# Patient Record
Sex: Male | Born: 1940 | Race: White | Hispanic: No | Marital: Married | Smoking: Never smoker
Health system: Southern US, Community
[De-identification: ages and names within clinical notes are randomized; demographics above are authoritative.]

## PROBLEM LIST (undated history)

## (undated) DIAGNOSIS — K219 Gastro-esophageal reflux disease without esophagitis: Secondary | ICD-10-CM

## (undated) DIAGNOSIS — C641 Malignant neoplasm of right kidney, except renal pelvis: Secondary | ICD-10-CM

## (undated) DIAGNOSIS — E785 Hyperlipidemia, unspecified: Secondary | ICD-10-CM

## (undated) DIAGNOSIS — Z9289 Personal history of other medical treatment: Secondary | ICD-10-CM

## (undated) DIAGNOSIS — M2550 Pain in unspecified joint: Secondary | ICD-10-CM

## (undated) DIAGNOSIS — N200 Calculus of kidney: Secondary | ICD-10-CM

## (undated) DIAGNOSIS — I10 Essential (primary) hypertension: Secondary | ICD-10-CM

## (undated) DIAGNOSIS — N400 Enlarged prostate without lower urinary tract symptoms: Secondary | ICD-10-CM

## (undated) HISTORY — DX: Hyperlipidemia, unspecified: E78.5

## (undated) HISTORY — DX: Personal history of other medical treatment: Z92.89

## (undated) HISTORY — PX: JOINT REPLACEMENT: SHX530

## (undated) HISTORY — DX: Pain in unspecified joint: M25.50

## (undated) HISTORY — DX: Essential (primary) hypertension: I10

## (undated) HISTORY — DX: Benign prostatic hyperplasia without lower urinary tract symptoms: N40.0

## (undated) HISTORY — PX: REPLACEMENT TOTAL KNEE: SUR1224

## (undated) HISTORY — DX: Malignant neoplasm of right kidney, except renal pelvis: C64.1

## (undated) HISTORY — DX: Calculus of kidney: N20.0

---

## 1978-05-28 HISTORY — PX: KIDNEY CYST REMOVAL: SHX684

## 2000-11-11 ENCOUNTER — Ambulatory Visit (HOSPITAL_BASED_OUTPATIENT_CLINIC_OR_DEPARTMENT_OTHER): Admission: RE | Admit: 2000-11-11 | Discharge: 2000-11-11 | Payer: Self-pay | Admitting: Orthopedic Surgery

## 2001-01-31 ENCOUNTER — Encounter: Payer: Self-pay | Admitting: Urology

## 2001-01-31 ENCOUNTER — Encounter: Admission: RE | Admit: 2001-01-31 | Discharge: 2001-01-31 | Payer: Self-pay | Admitting: Urology

## 2001-02-19 ENCOUNTER — Encounter: Admission: RE | Admit: 2001-02-19 | Discharge: 2001-02-19 | Payer: Self-pay | Admitting: Urology

## 2001-02-19 ENCOUNTER — Encounter: Payer: Self-pay | Admitting: Urology

## 2001-02-28 ENCOUNTER — Encounter: Payer: Self-pay | Admitting: Urology

## 2001-02-28 ENCOUNTER — Ambulatory Visit (HOSPITAL_COMMUNITY): Admission: RE | Admit: 2001-02-28 | Discharge: 2001-02-28 | Payer: Self-pay | Admitting: Urology

## 2001-03-04 ENCOUNTER — Encounter: Admission: RE | Admit: 2001-03-04 | Discharge: 2001-03-04 | Payer: Self-pay | Admitting: Urology

## 2001-03-04 ENCOUNTER — Encounter: Payer: Self-pay | Admitting: Urology

## 2002-03-19 ENCOUNTER — Encounter: Payer: Self-pay | Admitting: Orthopedic Surgery

## 2002-03-23 ENCOUNTER — Inpatient Hospital Stay (HOSPITAL_COMMUNITY): Admission: RE | Admit: 2002-03-23 | Discharge: 2002-03-26 | Payer: Self-pay | Admitting: Orthopedic Surgery

## 2003-06-03 ENCOUNTER — Ambulatory Visit (HOSPITAL_COMMUNITY): Admission: RE | Admit: 2003-06-03 | Discharge: 2003-06-03 | Payer: Self-pay | Admitting: Orthopedic Surgery

## 2003-07-26 ENCOUNTER — Inpatient Hospital Stay (HOSPITAL_COMMUNITY): Admission: RE | Admit: 2003-07-26 | Discharge: 2003-07-29 | Payer: Self-pay | Admitting: Orthopedic Surgery

## 2003-11-01 ENCOUNTER — Ambulatory Visit (HOSPITAL_COMMUNITY): Admission: RE | Admit: 2003-11-01 | Discharge: 2003-11-01 | Payer: Self-pay | Admitting: Internal Medicine

## 2003-11-30 ENCOUNTER — Encounter: Admission: RE | Admit: 2003-11-30 | Discharge: 2003-11-30 | Payer: Self-pay | Admitting: General Surgery

## 2003-12-01 ENCOUNTER — Ambulatory Visit (HOSPITAL_COMMUNITY): Admission: RE | Admit: 2003-12-01 | Discharge: 2003-12-01 | Payer: Self-pay | Admitting: General Surgery

## 2003-12-01 ENCOUNTER — Ambulatory Visit (HOSPITAL_BASED_OUTPATIENT_CLINIC_OR_DEPARTMENT_OTHER): Admission: RE | Admit: 2003-12-01 | Discharge: 2003-12-01 | Payer: Self-pay | Admitting: General Surgery

## 2004-05-28 HISTORY — PX: MELANOMA EXCISION: SHX5266

## 2010-08-24 ENCOUNTER — Emergency Department (HOSPITAL_BASED_OUTPATIENT_CLINIC_OR_DEPARTMENT_OTHER)
Admission: EM | Admit: 2010-08-24 | Discharge: 2010-08-24 | Disposition: A | Discharge status: Home / Self Care | Payer: Medicare Other | Attending: Emergency Medicine | Admitting: Emergency Medicine

## 2010-08-24 DIAGNOSIS — W268XXA Contact with other sharp object(s), not elsewhere classified, initial encounter: Secondary | ICD-10-CM | POA: Insufficient documentation

## 2010-08-24 DIAGNOSIS — I10 Essential (primary) hypertension: Secondary | ICD-10-CM | POA: Insufficient documentation

## 2010-08-24 DIAGNOSIS — S61409A Unspecified open wound of unspecified hand, initial encounter: Secondary | ICD-10-CM | POA: Insufficient documentation

## 2011-06-27 DIAGNOSIS — R972 Elevated prostate specific antigen [PSA]: Secondary | ICD-10-CM | POA: Diagnosis not present

## 2011-06-27 DIAGNOSIS — N401 Enlarged prostate with lower urinary tract symptoms: Secondary | ICD-10-CM | POA: Diagnosis not present

## 2011-09-05 DIAGNOSIS — R05 Cough: Secondary | ICD-10-CM | POA: Diagnosis not present

## 2011-09-05 DIAGNOSIS — J3089 Other allergic rhinitis: Secondary | ICD-10-CM | POA: Diagnosis not present

## 2011-10-10 DIAGNOSIS — M7660 Achilles tendinitis, unspecified leg: Secondary | ICD-10-CM | POA: Diagnosis not present

## 2011-10-30 DIAGNOSIS — R972 Elevated prostate specific antigen [PSA]: Secondary | ICD-10-CM | POA: Diagnosis not present

## 2011-10-30 DIAGNOSIS — N401 Enlarged prostate with lower urinary tract symptoms: Secondary | ICD-10-CM | POA: Diagnosis not present

## 2011-11-09 DIAGNOSIS — Z85820 Personal history of malignant melanoma of skin: Secondary | ICD-10-CM | POA: Diagnosis not present

## 2011-11-09 DIAGNOSIS — L570 Actinic keratosis: Secondary | ICD-10-CM | POA: Diagnosis not present

## 2011-11-09 DIAGNOSIS — D235 Other benign neoplasm of skin of trunk: Secondary | ICD-10-CM | POA: Diagnosis not present

## 2011-11-13 DIAGNOSIS — E782 Mixed hyperlipidemia: Secondary | ICD-10-CM | POA: Diagnosis not present

## 2011-11-13 DIAGNOSIS — Z79899 Other long term (current) drug therapy: Secondary | ICD-10-CM | POA: Diagnosis not present

## 2011-11-13 DIAGNOSIS — I10 Essential (primary) hypertension: Secondary | ICD-10-CM | POA: Diagnosis not present

## 2012-02-21 DIAGNOSIS — M7660 Achilles tendinitis, unspecified leg: Secondary | ICD-10-CM | POA: Diagnosis not present

## 2012-03-17 DIAGNOSIS — Z23 Encounter for immunization: Secondary | ICD-10-CM | POA: Diagnosis not present

## 2012-03-18 DIAGNOSIS — R972 Elevated prostate specific antigen [PSA]: Secondary | ICD-10-CM | POA: Diagnosis not present

## 2012-03-25 DIAGNOSIS — R972 Elevated prostate specific antigen [PSA]: Secondary | ICD-10-CM | POA: Diagnosis not present

## 2012-03-25 DIAGNOSIS — N401 Enlarged prostate with lower urinary tract symptoms: Secondary | ICD-10-CM | POA: Diagnosis not present

## 2012-03-25 DIAGNOSIS — N5314 Retrograde ejaculation: Secondary | ICD-10-CM | POA: Diagnosis not present

## 2012-03-31 ENCOUNTER — Other Ambulatory Visit: Payer: Self-pay | Admitting: Urology

## 2012-04-10 ENCOUNTER — Encounter (HOSPITAL_COMMUNITY): Payer: Self-pay | Admitting: Pharmacy Technician

## 2012-04-11 ENCOUNTER — Other Ambulatory Visit (HOSPITAL_COMMUNITY): Payer: Self-pay | Admitting: Urology

## 2012-04-14 ENCOUNTER — Encounter (HOSPITAL_COMMUNITY)
Admission: RE | Admit: 2012-04-14 | Discharge: 2012-04-14 | Disposition: A | Discharge status: Home / Self Care | Payer: Medicare Other | Source: Ambulatory Visit | Attending: Urology | Admitting: Urology

## 2012-04-14 ENCOUNTER — Ambulatory Visit (HOSPITAL_COMMUNITY)
Admission: RE | Admit: 2012-04-14 | Discharge: 2012-04-14 | Disposition: A | Discharge status: Home / Self Care | Payer: Medicare Other | Source: Ambulatory Visit | Attending: Urology | Admitting: Urology

## 2012-04-14 ENCOUNTER — Encounter (HOSPITAL_COMMUNITY): Payer: Self-pay

## 2012-04-14 DIAGNOSIS — Z01812 Encounter for preprocedural laboratory examination: Secondary | ICD-10-CM | POA: Diagnosis not present

## 2012-04-14 DIAGNOSIS — I517 Cardiomegaly: Secondary | ICD-10-CM | POA: Insufficient documentation

## 2012-04-14 DIAGNOSIS — F17200 Nicotine dependence, unspecified, uncomplicated: Secondary | ICD-10-CM | POA: Insufficient documentation

## 2012-04-14 DIAGNOSIS — R9431 Abnormal electrocardiogram [ECG] [EKG]: Secondary | ICD-10-CM | POA: Diagnosis not present

## 2012-04-14 DIAGNOSIS — Z01810 Encounter for preprocedural cardiovascular examination: Secondary | ICD-10-CM | POA: Insufficient documentation

## 2012-04-14 DIAGNOSIS — I10 Essential (primary) hypertension: Secondary | ICD-10-CM | POA: Insufficient documentation

## 2012-04-14 DIAGNOSIS — Z01818 Encounter for other preprocedural examination: Secondary | ICD-10-CM | POA: Diagnosis not present

## 2012-04-14 HISTORY — DX: Gastro-esophageal reflux disease without esophagitis: K21.9

## 2012-04-14 LAB — BASIC METABOLIC PANEL
BUN: 19 mg/dL (ref 6–23)
Calcium: 9.4 mg/dL (ref 8.4–10.5)
GFR calc Af Amer: >90 mL/min (ref >90)
GFR calc non Af Amer: 84 mL/min — ABNORMAL LOW (ref >90)
Glucose, Bld: 96 mg/dL (ref 70–99)
Potassium: 4.4 mEq/L (ref 3.5–5.1)
Sodium: 140 mEq/L (ref 135–145)

## 2012-04-14 LAB — SURGICAL PCR SCREEN
MRSA, PCR: NEGATIVE
Staphylococcus aureus: NEGATIVE

## 2012-04-14 LAB — CBC
HCT: 43 % (ref 39.0–52.0)
MCH: 30.2 pg (ref 26.0–34.0)
MCV: 86.5 fL (ref 78.0–100.0)
RBC: 4.97 MIL/uL (ref 4.22–5.81)
RDW: 12.4 % (ref 11.5–15.5)

## 2012-04-14 NOTE — Patient Instructions (Signed)
20      Your procedure is scheduled on:  Friday 04/18/2012 at 0900 am  Report to [facility] at  0630 AM.  Call this number if you have problems the morning of surgery: [phone]   Remember:   Do not eat food or drink liquids after midnight!  Take these medicines the morning of surgery with A SIP OF WATER: Proscar   Do not bring valuables to the hospital.  .  Leave suitcase in the car. After surgery it may be brought to your room.  For patients admitted to the hospital, checkout time is 11:00 AM the day of              Discharge.    Special Instructions: See [facility] Preparing  For Surgery Instruction Sheet. Do not wear jewelry, lotions powders, perfumes. Women do not shave legs or underarms for 12 hours before showers. Contacts, partial plates, or dentures may not be worn into surgery.                          Patients discharged the day of surgery will not be allowed to drive home.  If going home the same day of surgery, must have someone stay with you  first 24 hrs.at home and arrange for someone to drive you home from the Hospital.             YOUR DRIVER IS: TONI-spouse   Please read over the following fact sheets that you were given: MRSA INFORMATION,INCENTIVE SPIROMETRY SHEET, SLEEP APNEA SHEET                            Telford Nab.Jesus Wilds,RN,BSN     [phone]

## 2012-04-14 NOTE — Progress Notes (Signed)
04/14/12 0926  OBSTRUCTIVE SLEEP APNEA  Have you ever been diagnosed with sleep apnea through a sleep study? No  Do you snore loudly (loud enough to be heard through closed doors)?  0  Do you often feel tired, fatigued, or sleepy during the daytime? 0  Has anyone observed you stop breathing during your sleep? 0  Do you have, or are you being treated for high blood pressure? 1  BMI more than 35 kg/m2? 1  Age over 69 years old? 1  Neck circumference greater than 40 cm/18 inches? 1  Gender: 1  Obstructive Sleep Apnea Score 5   Score 4 or greater  Results sent to PCP

## 2012-04-18 ENCOUNTER — Encounter (HOSPITAL_COMMUNITY)
Admission: RE | Disposition: A | Discharge status: Home / Self Care | Payer: Self-pay | Source: Ambulatory Visit | Attending: Urology

## 2012-04-18 ENCOUNTER — Encounter (HOSPITAL_COMMUNITY): Payer: Self-pay | Admitting: Anesthesiology

## 2012-04-18 ENCOUNTER — Ambulatory Visit (HOSPITAL_COMMUNITY): Payer: Medicare Other | Admitting: Anesthesiology

## 2012-04-18 ENCOUNTER — Encounter (HOSPITAL_COMMUNITY): Payer: Self-pay | Admitting: *Deleted

## 2012-04-18 ENCOUNTER — Ambulatory Visit (HOSPITAL_COMMUNITY)
Admission: RE | Admit: 2012-04-18 | Discharge: 2012-04-18 | Disposition: A | Discharge status: Home / Self Care | Payer: Medicare Other | Source: Ambulatory Visit | Attending: Urology | Admitting: Urology

## 2012-04-18 DIAGNOSIS — N401 Enlarged prostate with lower urinary tract symptoms: Secondary | ICD-10-CM | POA: Insufficient documentation

## 2012-04-18 DIAGNOSIS — R972 Elevated prostate specific antigen [PSA]: Secondary | ICD-10-CM | POA: Insufficient documentation

## 2012-04-18 DIAGNOSIS — Z79899 Other long term (current) drug therapy: Secondary | ICD-10-CM | POA: Diagnosis not present

## 2012-04-18 DIAGNOSIS — Z7982 Long term (current) use of aspirin: Secondary | ICD-10-CM | POA: Insufficient documentation

## 2012-04-18 DIAGNOSIS — I10 Essential (primary) hypertension: Secondary | ICD-10-CM | POA: Diagnosis not present

## 2012-04-18 DIAGNOSIS — N5314 Retrograde ejaculation: Secondary | ICD-10-CM | POA: Insufficient documentation

## 2012-04-18 DIAGNOSIS — N400 Enlarged prostate without lower urinary tract symptoms: Secondary | ICD-10-CM | POA: Diagnosis not present

## 2012-04-18 HISTORY — PX: GREEN LIGHT LASER TURP (TRANSURETHRAL RESECTION OF PROSTATE: SHX6260

## 2012-04-18 SURGERY — GREEN LIGHT LASER TURP (TRANSURETHRAL RESECTION OF PROSTATE
Anesthesia: General | Wound class: Clean Contaminated

## 2012-04-18 MED ORDER — SULFAMETHOXAZOLE-TMP DS 800-160 MG PO TABS: 1.0000 | ORAL_TABLET | Freq: Two times a day (BID) | ORAL | Status: DC

## 2012-04-18 MED ORDER — ONDANSETRON HCL 4 MG/2ML IJ SOLN
INTRAMUSCULAR | Status: DC | PRN
  Administered 2012-04-18 (×2): 2 mg via INTRAVENOUS

## 2012-04-18 MED ORDER — LACTATED RINGERS IV SOLN
INTRAVENOUS | Status: DC | PRN
  Administered 2012-04-18 (×2): via INTRAVENOUS

## 2012-04-18 MED ORDER — DEXTROSE 5 % IV SOLN
3.0000 g | INTRAVENOUS | Status: AC
  Administered 2012-04-18: 3 g via INTRAVENOUS

## 2012-04-18 MED ORDER — SODIUM CHLORIDE 0.9 % IR SOLN
Status: DC | PRN
  Administered 2012-04-18: 3000 mL via INTRAVESICAL

## 2012-04-18 MED ORDER — LIDOCAINE HCL (CARDIAC) 20 MG/ML IV SOLN
INTRAVENOUS | Status: DC | PRN
  Administered 2012-04-18: 20 mg via INTRAVENOUS

## 2012-04-18 MED ORDER — OXYCODONE HCL 5 MG/5ML PO SOLN
5.0000 mg | Freq: Once | ORAL | Status: DC | PRN
  Filled 2012-04-18: qty 5

## 2012-04-18 MED ORDER — PROPOFOL 10 MG/ML IV EMUL
INTRAVENOUS | Status: DC | PRN
  Administered 2012-04-18: 200 mg via INTRAVENOUS
  Administered 2012-04-18: 50 mg via INTRAVENOUS
  Administered 2012-04-18: 30 mg via INTRAVENOUS
  Administered 2012-04-18: 20 mg via INTRAVENOUS

## 2012-04-18 MED ORDER — FENTANYL CITRATE 0.05 MG/ML IJ SOLN
INTRAMUSCULAR | Status: DC | PRN
  Administered 2012-04-18: 50 ug via INTRAVENOUS
  Administered 2012-04-18 (×2): 25 ug via INTRAVENOUS

## 2012-04-18 MED ORDER — CEFAZOLIN SODIUM-DEXTROSE 2-3 GM-% IV SOLR
INTRAVENOUS | Status: AC
  Filled 2012-04-18: qty 50

## 2012-04-18 MED ORDER — CEFAZOLIN SODIUM 1-5 GM-% IV SOLN
INTRAVENOUS | Status: AC
  Filled 2012-04-18: qty 50

## 2012-04-18 MED ORDER — ACETAMINOPHEN 10 MG/ML IV SOLN: 1000.0000 mg | Freq: Once | INTRAVENOUS | Status: DC | PRN

## 2012-04-18 MED ORDER — INDIGOTINDISULFONATE SODIUM 8 MG/ML IJ SOLN
INTRAMUSCULAR | Status: AC
  Filled 2012-04-18: qty 5

## 2012-04-18 MED ORDER — ACETAMINOPHEN 10 MG/ML IV SOLN
INTRAVENOUS | Status: AC
  Filled 2012-04-18: qty 100

## 2012-04-18 MED ORDER — HYDROCODONE-ACETAMINOPHEN 7.5-650 MG PO TABS: 1.0000 | ORAL_TABLET | Freq: Four times a day (QID) | ORAL | Status: DC | PRN

## 2012-04-18 MED ORDER — BELLADONNA ALKALOIDS-OPIUM 16.2-60 MG RE SUPP
RECTAL | Status: DC | PRN
  Administered 2012-04-18: 1 via RECTAL

## 2012-04-18 MED ORDER — KETOROLAC TROMETHAMINE 30 MG/ML IJ SOLN
INTRAMUSCULAR | Status: DC | PRN
  Administered 2012-04-18: 30 mg via INTRAVENOUS

## 2012-04-18 MED ORDER — MEPERIDINE HCL 50 MG/ML IJ SOLN: 6.2500 mg | INTRAMUSCULAR | Status: DC | PRN

## 2012-04-18 MED ORDER — BELLADONNA ALKALOIDS-OPIUM 16.2-60 MG RE SUPP
RECTAL | Status: AC
  Filled 2012-04-18: qty 1

## 2012-04-18 MED ORDER — ACETAMINOPHEN 10 MG/ML IV SOLN
INTRAVENOUS | Status: DC | PRN
  Administered 2012-04-18: 1000 mg via INTRAVENOUS

## 2012-04-18 MED ORDER — LACTATED RINGERS IV SOLN
INTRAVENOUS | Status: DC
  Administered 2012-04-18: 1000 mL via INTRAVENOUS

## 2012-04-18 MED ORDER — URELLE 81 MG PO TABS: 1.0000 | ORAL_TABLET | Freq: Three times a day (TID) | ORAL | Status: DC

## 2012-04-18 MED ORDER — OXYCODONE HCL 5 MG PO TABS: 5.0000 mg | ORAL_TABLET | Freq: Once | ORAL | Status: DC | PRN

## 2012-04-18 MED ORDER — HYDROMORPHONE HCL PF 1 MG/ML IJ SOLN: 0.2500 mg | INTRAMUSCULAR | Status: DC | PRN

## 2012-04-18 MED ORDER — PROMETHAZINE HCL 25 MG/ML IJ SOLN: 6.2500 mg | INTRAMUSCULAR | Status: DC | PRN

## 2012-04-18 MED ORDER — LIDOCAINE HCL 2 % EX GEL
CUTANEOUS | Status: AC
  Filled 2012-04-18: qty 10

## 2012-04-18 SURGICAL SUPPLY — 25 items
BAG URINE DRAINAGE (UROLOGICAL SUPPLIES) ×2 IMPLANT
BAG URO CATCHER STRL LF (DRAPE) ×2 IMPLANT
CATH FOLEY 2WAY SLVR  5CC 18FR (CATHETERS)
CATH FOLEY 2WAY SLVR  5CC 20FR (CATHETERS) ×1
CATH FOLEY 2WAY SLVR 5CC 18FR (CATHETERS) IMPLANT
CATH FOLEY 2WAY SLVR 5CC 20FR (CATHETERS) IMPLANT
CLOTH BEACON ORANGE TIMEOUT ST (SAFETY) ×2 IMPLANT
DRAPE CAMERA CLOSED 9X96 (DRAPES) ×2 IMPLANT
ELECT BUTTON HF 24-28F 2 30DE (ELECTRODE) IMPLANT
ELECT LOOP MED HF 24F 12D (CUTTING LOOP) IMPLANT
ELECT LOOP MED HF 24F 12D CBL (CLIP) IMPLANT
ELECT RESECT VAPORIZE 12D CBL (ELECTRODE) IMPLANT
FEE RENTAL LASER GREENLIGHT (Laser) IMPLANT
GLOVE BIOGEL M STRL SZ7.5 (GLOVE) ×2 IMPLANT
GOWN STRL REIN XL XLG (GOWN DISPOSABLE) ×2 IMPLANT
HOLDER FOLEY CATH W/STRAP (MISCELLANEOUS) ×1 IMPLANT
IV NS IRRIG 3000ML ARTHROMATIC (IV SOLUTION) ×6 IMPLANT
KIT ASPIRATION TUBING (SET/KITS/TRAYS/PACK) ×1 IMPLANT
LASER FIBER /GREENLIGHT LASER (Laser) ×1 IMPLANT
LASER GREENLIGHT RENTAL P/PROC (Laser) ×2 IMPLANT
MANIFOLD NEPTUNE II (INSTRUMENTS) ×2 IMPLANT
PACK CYSTO (CUSTOM PROCEDURE TRAY) ×2 IMPLANT
SYR 30ML LL (SYRINGE) ×1 IMPLANT
SYRINGE IRR TOOMEY STRL 70CC (SYRINGE) IMPLANT
TUBING CONNECTING 10 (TUBING) ×2 IMPLANT

## 2012-04-18 NOTE — Op Note (Signed)
Pre-operative diagnosis : BPH  Postoperative diagnosis: Same  Operation: Green light laser vaporization of prostate  Surgeon:  S. Patsi Sears, MD  First assistant: None  [id number]}  Preparation: After appropriate preanesthesia, the patient was brought to the operating room, placed on the operating table in the dorsal supine position where general LMA anesthesia was introduced. The Endo suppository was placed. IV Tylenol was given at the beginning of the case, and IV Toradol was given at the end of the case.  Review history: Active Problems  Problems  1. 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 PSA,Elevated 790.93  3. 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 He last discussed CTT on 06/27/11 because he would like to stop taking Finasteride due to interference with sex life. CTT was not available until 9/13, but he will be out of state until after Oct. He would call and make appt by end of year He was seen previously for increased PSA of 8.34 with a Psa II of 18.2% on 01-17-09 (despite being on Finasteride 5 mg 1 po daily). He subsequently underwent a prostate biopsy on 04-15-09 which was negative. He has noticed a great improvement in urinary flow, improvement in nocturia. He does have normal libido and erections; however, does have loss of sensation at orgasim and no ejaculation. Otherwise he is without complaint. Pathology shows inflammation only. PUS= 134cc--94cc to 91.8cc).      Statement of  Likelihood of Success: Excellent. TIME-OUT observed.:  Procedure: Cystourethroscopy was accomplished, and shows that the patient has trilobar BPH. A natural trough was noted at the 4:00 position, and this was developed with the greenlight laser. The lateral lobe on the left side was also vaporized. Following this, a trough at the 7:00  position was accomplished, and part of the lateral lobe on the right side was vaporized. The vera was left intact. No bleeding was noted. The patient tolerated the procedure well. A size 20 Foley catheter with 10 cc balloon was placed, with 20 cc in the balloon encased traction is necessary later. The patient was awakened and taken to recovery room in good condition.

## 2012-04-18 NOTE — Progress Notes (Signed)
Pt and wife instructed on catheter care. And how to empty cath bag

## 2012-04-18 NOTE — H&P (Signed)
omplaint  cc: Dr. Dewain Penning   Reason For Visit     4 mo f/u   Active Problems Problems  1. Benign Prostatic Hyperplasia Localized With Urinary Obstruction With Other Lower Urinary Tract  Symptoms 600.21 2. PSA,Elevated 790.93 3. 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  He last discussed CTT on 06/27/11 because he would like to stop taking Finasteride due to interference with sex life.  CTT was not available until 9/13, but he will be out of state until after Oct. He would call and make appt by end of year   He was seen previously for increased PSA of 8.34 with a Psa II of 18.2% on 01-17-09 (despite being on Finasteride 5 mg 1 po daily).  He subsequently underwent a prostate biopsy on 04-15-09 which was negative.   He has noticed a great improvement in urinary flow, improvement in nocturia.  He does have normal libido and erections; however, does have loss of sensation at orgasim and no ejaculation.  Otherwise he is without complaint. Pathology shows  inflammation only. PUS= 134cc--94cc to 91.8cc).  03/18/12  PSA - 4.09/32%  (corrected PSA of  8.18 due to taking Finasteride) 10/30/11  PSA - 2.52/24% ( corrected PSA of 5.04 due to taking Finasteride) 01/03/11  PSA - 2.38 (corrected value of 4.76 due to taking Finasteride).   Past Medical History Problems  1. History of  Cancer 199.1 2. History of  Hypertension 401.9 3. History of  Nephrolithiasis V13.01  Surgical History Problems  1. History of  Knee Replacement  Current Meds 1. Aspirin 81 MG Oral Tablet; Therapy: (Recorded:27Jan2010) to 2. Benazepril HCl 40 MG Oral Tablet; Therapy: 26May2011 to 3. Loratadine 10 MG Oral Tablet; Therapy: 10Apr2013 to  Allergies Medication  1. No Known Drug Allergies  Family History Problems  1. Paternal history of  Death In The Family Father age 47; cancer 2. Maternal history of  Death In The Family  Mother age 48; heart problems 3. Family history of  Family Health Status Number Of Children 2 sons  Social History Problems  1. Caffeine Use 1 a day 2. Marital History - Currently Married 3. Never A Smoker 4. Occupation: retired Nurse, children's  5. History of  Alcohol Use 6. History of  Tobacco Use  Review of Systems Constitutional, skin, eye, otolaryngeal, hematologic/lymphatic, cardiovascular, pulmonary, endocrine, musculoskeletal, gastrointestinal, neurological and psychiatric system(s) were reviewed and pertinent findings if present are noted.  Genitourinary: urinary frequency, nocturia and erectile dysfunction.    Vitals Vital Signs [Data Includes: Last 1 Day]  29Oct2013 08:19AM  Blood Pressure: 130 / 83 Temperature: 97.8 F Heart Rate: 77  Physical Exam Rectal: Rectal exam demonstrates normal sphincter tone. Estimated prostate size is 4+.    Results/Data   IPSS: The IPSS today is 3  Selected Results  PSA FREE & TOTAL 22Oct2013 08:29AM Jesus Yang  SPECIMEN TYPE: BLOOD   Test Name Result Flag Reference  PSA 4.09 ng/mL H <=4.00  Test Methodology: ECLIA PSA (Electrochemiluminescence Immunoassay) Result repeated and verified.  PSA, FREE 1.32 ng/mL    PSA, %FREE 32 %  > 25  Probability of Prostate Cancer   (For Men with Non-Suspicious DRE Results and PSA Between 4 and   10 ng/mL, By Patient Age)     % free PSA  Patient Age                          60 to 8 Years  50 to 76 Years  >70 Years    <=10%                  49.2%           57.5%          64.5%    11 - 18%               26.9%           33.9%          40.8%    19 - 25%               18.3%           23.9%          29.7%    >25%                    9.1%           12.2%          15.8%    25 Mar 2012 7:52 AM   UA With REFLEX       COLOR YELLOW       APPEARANCE CLEAR       SPECIFIC GRAVITY 1.030       pH 6.0       GLUCOSE NEG       BILIRUBIN NEG       KETONE NEG       BLOOD NEG        PROTEIN NEG       UROBILINOGEN 0.2       NITRITE NEG       LEUKOCYTE ESTERASE NEG   Assessment Assessed  1. Benign Prostatic Hyperplasia Localized With Urinary Obstruction With Other Lower Urinary Tract  Symptoms 600.21 2. PSA,Elevated 790.93 3. Retrograde Ejaculation 45.87      71 yo male with 93 gram prostate post finestereide terapy ( was 134cc). He has reduced his IPSS to 3; however, he has reduced his libido, and his ejaculate wit the finesteride, and now wants to get off the medication. We have re-cked his PUS today ( 903 grams), and wil proceed with trough with  Burna Mortimer laser vs Gyrus button/loop. He wil be done as op and RTC for catheter removal 2 days post op.    Plan    Schedule surgical intervention.     UA With REFLEX  Status: Resulted - Requires Verification  Done: 01Jan0001 12:00AM Ordered Today; For: Health Maintenance (V70.0); Ordered By: Jesus Yang  Due: 31Oct2013 Marked Important; Last Updated By: Nathaniel Man PROSTATE U/S  Status: Resulted - Requires Verification  Done: 01Jan0001 12:00AM Ordered Today; For: Benign Prostatic Hyperplasia Localized With Urinary Obstruction With Other Lower Urinary Tract Symptoms (600.21); Ordered By: Jesus Yang  Due: 31Oct2013 Marked Important; Last Updated By: Dorcas Mcmurray   Signatures Electronically signed by : Jesus Yang, M.D.; Mar 25 2012 10:47AM

## 2012-04-18 NOTE — Anesthesia Preprocedure Evaluation (Addendum)
Anesthesia Evaluation  Patient identified by MRN, date of birth, ID band Patient awake    Reviewed: Allergy & Precautions, H&P , NPO status , Patient's Chart, lab work & pertinent test results  Airway Mallampati: II TM Distance: >3 FB Neck ROM: Full    Dental  (+) Dental Advisory Given, Teeth Intact and Chipped   Pulmonary  breath sounds clear to auscultation  Pulmonary exam normal       Cardiovascular hypertension, Pt. on medications Rhythm:Regular Rate:Normal     Neuro/Psych negative neurological ROS  negative psych ROS   GI/Hepatic Neg liver ROS, GERD-  Medicated,  Endo/Other  negative endocrine ROS  Renal/GU Renal disease     Musculoskeletal negative musculoskeletal ROS (+)   Abdominal   Peds  Hematology negative hematology ROS (+)   Anesthesia Other Findings   Reproductive/Obstetrics                          Anesthesia Physical Anesthesia Plan  ASA: III  Anesthesia Plan: General   Post-op Pain Management:    Induction: Intravenous  Airway Management Planned: LMA  Additional Equipment:   Intra-op Plan:   Post-operative Plan: Extubation in OR  Informed Consent: I have reviewed the patients History and Physical, chart, labs and discussed the procedure including the risks, benefits and alternatives for the proposed anesthesia with the patient or authorized representative who has indicated his/her understanding and acceptance.   Dental advisory given  Plan Discussed with: CRNA  Anesthesia Plan Comments:        Anesthesia Quick Evaluation

## 2012-04-18 NOTE — Transfer of Care (Signed)
Immediate Anesthesia Transfer of Care Note  Patient: Jesus Yang  Procedure(s) Performed: Procedure(s) (LRB) with comments: GREEN LIGHT LASER TURP (TRANSURETHRAL RESECTION OF PROSTATE (N/A)  Patient Location: PACU  Anesthesia Type:General  Level of Consciousness: awake, alert , oriented and patient cooperative  Airway & Oxygen Therapy: Patient Spontanous Breathing and Patient connected to face mask  Post-op Assessment: Report given to PACU RN and Post -op Vital signs reviewed and stable  Post vital signs: Reviewed and stable  Complications: No apparent anesthesia complications

## 2012-04-18 NOTE — Interval H&P Note (Signed)
History and Physical Interval Note:  04/18/2012 9:26 AM  Jesus Yang  has presented today for surgery, with the diagnosis of benign prostatic hyperplasia  The various methods of treatment have been discussed with the patient and family. After consideration of risks, benefits and other options for treatment, the patient has consented to  Procedure(s) (LRB) with comments: GREEN LIGHT LASER TURP (TRANSURETHRAL RESECTION OF PROSTATE (N/A) - Burna Mortimer Laser or Gyrus Loop Button Vaporization of Prostate as a surgical intervention .  The patient's history has been reviewed, patient examined, no change in status, stable for surgery.  I have reviewed the patient's chart and labs.  Questions were answered to the patient's satisfaction.     Jethro Bolus I

## 2012-04-18 NOTE — Anesthesia Postprocedure Evaluation (Signed)
Anesthesia Post Note  Patient: Jesus Yang  Procedure(s) Performed: Procedure(s) (LRB): GREEN LIGHT LASER TURP (TRANSURETHRAL RESECTION OF PROSTATE (N/A)  Anesthesia type: General  Patient location: PACU  Post pain: Pain level controlled  Post assessment: Post-op Vital signs reviewed  Last Vitals: BP 134/70  Pulse 70  Temp 36.6 C  Resp 12  SpO2 99%  Post vital signs: Reviewed  Level of consciousness: sedated  Complications: No apparent anesthesia complications

## 2012-04-21 ENCOUNTER — Encounter (HOSPITAL_COMMUNITY): Payer: Self-pay | Admitting: Urology

## 2012-05-02 DIAGNOSIS — N401 Enlarged prostate with lower urinary tract symptoms: Secondary | ICD-10-CM | POA: Diagnosis not present

## 2012-08-06 DIAGNOSIS — N401 Enlarged prostate with lower urinary tract symptoms: Secondary | ICD-10-CM | POA: Diagnosis not present

## 2012-10-21 ENCOUNTER — Other Ambulatory Visit: Payer: Self-pay | Admitting: Gastroenterology

## 2012-10-21 DIAGNOSIS — K5730 Diverticulosis of large intestine without perforation or abscess without bleeding: Secondary | ICD-10-CM | POA: Diagnosis not present

## 2012-10-21 DIAGNOSIS — Z1211 Encounter for screening for malignant neoplasm of colon: Secondary | ICD-10-CM | POA: Diagnosis not present

## 2012-10-21 DIAGNOSIS — D126 Benign neoplasm of colon, unspecified: Secondary | ICD-10-CM | POA: Diagnosis not present

## 2012-12-31 ENCOUNTER — Encounter: Payer: Self-pay | Admitting: *Deleted

## 2013-01-13 DIAGNOSIS — L819 Disorder of pigmentation, unspecified: Secondary | ICD-10-CM | POA: Diagnosis not present

## 2013-01-13 DIAGNOSIS — D1801 Hemangioma of skin and subcutaneous tissue: Secondary | ICD-10-CM | POA: Diagnosis not present

## 2013-01-13 DIAGNOSIS — L570 Actinic keratosis: Secondary | ICD-10-CM | POA: Diagnosis not present

## 2013-01-13 DIAGNOSIS — D235 Other benign neoplasm of skin of trunk: Secondary | ICD-10-CM | POA: Diagnosis not present

## 2013-01-13 DIAGNOSIS — Z85820 Personal history of malignant melanoma of skin: Secondary | ICD-10-CM | POA: Diagnosis not present

## 2013-04-01 DIAGNOSIS — I10 Essential (primary) hypertension: Secondary | ICD-10-CM | POA: Diagnosis not present

## 2013-04-01 DIAGNOSIS — Z0000 Encounter for general adult medical examination without abnormal findings: Secondary | ICD-10-CM | POA: Diagnosis not present

## 2013-04-01 DIAGNOSIS — Z23 Encounter for immunization: Secondary | ICD-10-CM | POA: Diagnosis not present

## 2013-04-01 DIAGNOSIS — B079 Viral wart, unspecified: Secondary | ICD-10-CM | POA: Diagnosis not present

## 2013-04-02 ENCOUNTER — Other Ambulatory Visit: Payer: Self-pay | Admitting: Dermatology

## 2013-04-02 DIAGNOSIS — C44319 Basal cell carcinoma of skin of other parts of face: Secondary | ICD-10-CM | POA: Diagnosis not present

## 2013-05-01 DIAGNOSIS — Z85828 Personal history of other malignant neoplasm of skin: Secondary | ICD-10-CM | POA: Diagnosis not present

## 2013-05-01 DIAGNOSIS — D485 Neoplasm of uncertain behavior of skin: Secondary | ICD-10-CM | POA: Diagnosis not present

## 2013-05-08 DIAGNOSIS — R05 Cough: Secondary | ICD-10-CM | POA: Diagnosis not present

## 2013-05-11 DIAGNOSIS — I517 Cardiomegaly: Secondary | ICD-10-CM | POA: Diagnosis not present

## 2013-05-11 DIAGNOSIS — R05 Cough: Secondary | ICD-10-CM | POA: Diagnosis not present

## 2013-05-13 DIAGNOSIS — R0989 Other specified symptoms and signs involving the circulatory and respiratory systems: Secondary | ICD-10-CM | POA: Diagnosis not present

## 2013-05-13 DIAGNOSIS — R05 Cough: Secondary | ICD-10-CM | POA: Diagnosis not present

## 2013-05-13 DIAGNOSIS — R059 Cough, unspecified: Secondary | ICD-10-CM | POA: Diagnosis not present

## 2013-05-13 DIAGNOSIS — I517 Cardiomegaly: Secondary | ICD-10-CM | POA: Diagnosis not present

## 2013-05-13 DIAGNOSIS — H6120 Impacted cerumen, unspecified ear: Secondary | ICD-10-CM | POA: Diagnosis not present

## 2013-05-13 DIAGNOSIS — R0609 Other forms of dyspnea: Secondary | ICD-10-CM | POA: Diagnosis not present

## 2013-05-29 DIAGNOSIS — R972 Elevated prostate specific antigen [PSA]: Secondary | ICD-10-CM | POA: Diagnosis not present

## 2013-05-29 DIAGNOSIS — N401 Enlarged prostate with lower urinary tract symptoms: Secondary | ICD-10-CM | POA: Diagnosis not present

## 2013-05-29 DIAGNOSIS — N139 Obstructive and reflux uropathy, unspecified: Secondary | ICD-10-CM | POA: Diagnosis not present

## 2013-07-01 ENCOUNTER — Ambulatory Visit (INDEPENDENT_AMBULATORY_CARE_PROVIDER_SITE_OTHER): Payer: Medicare Other

## 2013-07-01 ENCOUNTER — Ambulatory Visit (INDEPENDENT_AMBULATORY_CARE_PROVIDER_SITE_OTHER): Payer: Medicare Other | Admitting: Podiatry

## 2013-07-01 ENCOUNTER — Encounter: Payer: Self-pay | Admitting: Podiatry

## 2013-07-01 VITALS — BP 167/88 | HR 72 | Resp 16

## 2013-07-01 DIAGNOSIS — M7750 Other enthesopathy of unspecified foot: Secondary | ICD-10-CM | POA: Diagnosis not present

## 2013-07-01 DIAGNOSIS — M79673 Pain in unspecified foot: Secondary | ICD-10-CM

## 2013-07-01 DIAGNOSIS — M79609 Pain in unspecified limb: Secondary | ICD-10-CM

## 2013-07-01 DIAGNOSIS — R609 Edema, unspecified: Secondary | ICD-10-CM

## 2013-07-01 MED ORDER — TRIAMCINOLONE ACETONIDE 10 MG/ML IJ SUSP
10.0000 mg | Freq: Once | INTRAMUSCULAR | Status: AC
  Administered 2013-07-01: 10 mg

## 2013-07-01 NOTE — Progress Notes (Signed)
Subjective:     Patient ID: Jesus Yang, male   DOB: 1940-07-28, 73 y.o.   MRN: [id number]  HPI patient points to right foot and stating it's been hurting in the inside of the foot were about 2 months. Points to the cuneiform and base of the first metatarsal states it's been inflamed with no history of injury   Review of Systems     Objective:   Physical Exam Neurovascular status intact with discomfort around the base of the first metatarsocuneiform with no loss of muscle strength anterior tibial or posterior tibial muscle    Assessment:     Tendinitis right anterior tibial muscle at its insertion into cuneiform base of first metatarsal    Plan:     X-ray reviewed and injection accomplished. He is going to babysit his grandchildren for the next 2 weeks and will be seen back after that for possible immobilization

## 2013-07-16 ENCOUNTER — Encounter: Payer: Self-pay | Admitting: Podiatry

## 2013-07-16 ENCOUNTER — Ambulatory Visit (INDEPENDENT_AMBULATORY_CARE_PROVIDER_SITE_OTHER): Payer: Medicare Other | Admitting: Podiatry

## 2013-07-16 VITALS — BP 144/83 | HR 79 | Resp 18

## 2013-07-16 DIAGNOSIS — M7750 Other enthesopathy of unspecified foot: Secondary | ICD-10-CM | POA: Diagnosis not present

## 2013-07-16 NOTE — Progress Notes (Signed)
° °  Subjective:    Patient ID: Jesus Yang, male    DOB: March 20, 1941, 73 y.o.   MRN: [id number]  HPI It has improved on my right foot    Review of Systems     Objective:   Physical Exam        Assessment & Plan:

## 2013-07-16 NOTE — Progress Notes (Signed)
Subjective:     Patient ID: Jesus Yang, male   DOB: Oct 18, 1940, 73 y.o.   MRN: [id number]  HPI patient states that his foot has improved but it still is tender when he weightbears for a significant period of time   Review of Systems     Objective:   Physical Exam Neurovascular status unchanged with patient well oriented x3 with mild discomfort at the anterior tibial insertion into the base of first metatarsal cuneiform joint    Assessment:     Continue tendinitis but improved right foot    Plan:     Advised on physical therapy ice therapy and scanned for custom orthotics to lift the medial arch. Reappoint when orthotics returned

## 2013-07-31 DIAGNOSIS — Z85828 Personal history of other malignant neoplasm of skin: Secondary | ICD-10-CM | POA: Diagnosis not present

## 2013-08-20 ENCOUNTER — Ambulatory Visit (INDEPENDENT_AMBULATORY_CARE_PROVIDER_SITE_OTHER): Payer: Medicare Other | Admitting: Podiatry

## 2013-08-20 ENCOUNTER — Encounter: Payer: Self-pay | Admitting: Podiatry

## 2013-08-20 VITALS — BP 137/73 | HR 84 | Resp 16

## 2013-08-20 DIAGNOSIS — M7750 Other enthesopathy of unspecified foot: Secondary | ICD-10-CM

## 2013-08-20 NOTE — Progress Notes (Signed)
Subjective:     Patient ID: Jesus Yang, male   DOB: 01-13-1941, 73 y.o.   MRN: [id number]  HPI patient presents to pick up orthotics stating my foot has been okay but him on it too much I still noticed   Review of Systems     Objective:   Physical Exam Neurovascular status intact with no health history changes noted and patient having discomfort at the anterior tib insertion right of a mild nature    Assessment:     Improved tendinitis symptoms right    Plan:     Orthotics dispensed with instructions and physical therapy discussed with patient reappoint her recheck in 6 weeks

## 2013-08-20 NOTE — Patient Instructions (Signed)

## 2013-10-15 DIAGNOSIS — M5030 Other cervical disc degeneration, unspecified cervical region: Secondary | ICD-10-CM | POA: Diagnosis not present

## 2013-10-15 DIAGNOSIS — M25519 Pain in unspecified shoulder: Secondary | ICD-10-CM | POA: Diagnosis not present

## 2013-10-15 DIAGNOSIS — M542 Cervicalgia: Secondary | ICD-10-CM | POA: Diagnosis not present

## 2013-10-19 ENCOUNTER — Emergency Department (HOSPITAL_BASED_OUTPATIENT_CLINIC_OR_DEPARTMENT_OTHER)
Admission: EM | Admit: 2013-10-19 | Discharge: 2013-10-19 | Disposition: A | Discharge status: Home / Self Care | Payer: Medicare Other | Attending: Emergency Medicine | Admitting: Emergency Medicine

## 2013-10-19 ENCOUNTER — Encounter (HOSPITAL_BASED_OUTPATIENT_CLINIC_OR_DEPARTMENT_OTHER): Payer: Self-pay | Admitting: Emergency Medicine

## 2013-10-19 DIAGNOSIS — Z8719 Personal history of other diseases of the digestive system: Secondary | ICD-10-CM | POA: Insufficient documentation

## 2013-10-19 DIAGNOSIS — E785 Hyperlipidemia, unspecified: Secondary | ICD-10-CM | POA: Insufficient documentation

## 2013-10-19 DIAGNOSIS — I10 Essential (primary) hypertension: Secondary | ICD-10-CM | POA: Insufficient documentation

## 2013-10-19 DIAGNOSIS — Z87442 Personal history of urinary calculi: Secondary | ICD-10-CM | POA: Diagnosis not present

## 2013-10-19 DIAGNOSIS — R42 Dizziness and giddiness: Secondary | ICD-10-CM | POA: Insufficient documentation

## 2013-10-19 DIAGNOSIS — Z7982 Long term (current) use of aspirin: Secondary | ICD-10-CM | POA: Insufficient documentation

## 2013-10-19 MED ORDER — MECLIZINE HCL 50 MG PO TABS: 50.0000 mg | ORAL_TABLET | Freq: Three times a day (TID) | ORAL | Status: DC | PRN

## 2013-10-19 MED ORDER — MECLIZINE HCL 25 MG PO TABS
25.0000 mg | ORAL_TABLET | Freq: Once | ORAL | Status: AC
  Administered 2013-10-19: 25 mg via ORAL
  Filled 2013-10-19: qty 1

## 2013-10-19 NOTE — ED Notes (Signed)
Denies dizziness at present time  

## 2013-10-19 NOTE — Discharge Instructions (Signed)
No driving until 24 hours without vertigo. Take meclizine until 24 hours without vertigo.  Benign Positional Vertigo Vertigo means you feel like you or your surroundings are moving when they are not. Benign positional vertigo is the most common form of vertigo. Benign means that the cause of your condition is not serious. Benign positional vertigo is more common in older adults. CAUSES  Benign positional vertigo is the result of an upset in the labyrinth system. This is an area in the middle ear that helps control your balance. This may be caused by a viral infection, head injury, or repetitive motion. However, often no specific cause is found. SYMPTOMS  Symptoms of benign positional vertigo occur when you move your head or eyes in different directions. Some of the symptoms may include:  Loss of balance and falls.  Vomiting.  Blurred vision.  Dizziness.  Nausea.  Involuntary eye movements (nystagmus). DIAGNOSIS  Benign positional vertigo is usually diagnosed by physical exam. If the specific cause of your benign positional vertigo is unknown, your caregiver may perform imaging tests, such as magnetic resonance imaging (MRI) or computed tomography (CT). TREATMENT  Your caregiver may recommend movements or procedures to correct the benign positional vertigo. Medicines such as meclizine, benzodiazepines, and medicines for nausea may be used to treat your symptoms. In rare cases, if your symptoms are caused by certain conditions that affect the inner ear, you may need surgery. HOME CARE INSTRUCTIONS   Follow your caregiver's instructions.  Move slowly. Do not make sudden body or head movements.  Avoid driving.  Avoid operating heavy machinery.  Avoid performing any tasks that would be dangerous to you or others during a vertigo episode.  Drink enough fluids to keep your urine clear or pale yellow. SEEK IMMEDIATE MEDICAL CARE IF:   You develop problems with walking, weakness,  numbness, or using your arms, hands, or legs.  You have difficulty speaking.  You develop severe headaches.  Your nausea or vomiting continues or gets worse.  You develop visual changes.  Your family or friends notice any behavioral changes.  Your condition gets worse.  You have a fever.  You develop a stiff neck or sensitivity to light. MAKE SURE YOU:   Understand these instructions.  Will watch your condition.  Will get help right away if you are not doing well or get worse. Document Released: 02/19/2006 Document Revised: 08/06/2011 Document Reviewed: 02/01/2011 [facility] Patient Information 2014 [place].

## 2013-10-19 NOTE — ED Provider Notes (Signed)
CSN: [id number]     Arrival date & time 10/19/13  1136 History   First MD Initiated Contact with Patient 10/19/13 1224     Chief Complaint  Patient presents with  . Dizziness     ) HPI  73 year old male otherwise healthy with exception of mild hypertension presents with dizziness described as spinning. Today is Monday. He states Saturday evening he lay down in bed. When he did he thought the room was spinning. He had a slowly lay down over a period of an hour. Is able to get to sleep. Saturday night he was able to sleep. Sunday (which was yesterday) vertigo with head movements or position change during the day. Improve how last evening. More improve this morning but does persist with rapid head movements. They're leaving on a trip on Wednesday or Thursday and he wanted to be evaluated. No headache. No vision changes. No nausea. No numbness weakness or tingling to extremities. No other neurological complaints. No past similar episodes.  Past Medical History  Diagnosis Date  . Kidney stone   . Hypertension   . Hyperlipidemia   . GERD (gastroesophageal reflux disease)    Past Surgical History  Procedure Laterality Date  . Kidney cyst removal  1980  . Replacement total knee    . Melanoma excision  2006    of chest  . Joint replacement      bilateral knee  . Green light laser turp (transurethral resection of prostate  04/18/2012    Procedure: GREEN LIGHT LASER TURP (TRANSURETHRAL RESECTION OF PROSTATE;  Surgeon: Ailene Rud, MD;  Location: [facility];  Service: Urology;  Laterality: N/A;   No family history on file. History  Substance Use Topics  . Smoking status: Never Smoker   . Smokeless tobacco: Not on file  . Alcohol Use: Yes     Comment: occassionally    Review of Systems  Constitutional: Negative for fever, chills, diaphoresis, appetite change and fatigue.  HENT: Negative for mouth sores, sore throat and trouble swallowing.   Eyes: Negative for visual disturbance.   Respiratory: Negative for cough, chest tightness, shortness of breath and wheezing.   Cardiovascular: Negative for chest pain.  Gastrointestinal: Negative for nausea, vomiting, abdominal pain, diarrhea and abdominal distention.  Endocrine: Negative for polydipsia, polyphagia and polyuria.  Genitourinary: Negative for dysuria, frequency and hematuria.  Musculoskeletal: Negative for gait problem.  Skin: Negative for color change, pallor and rash.  Neurological: Positive for dizziness. Negative for syncope, light-headedness and headaches.  Hematological: Does not bruise/bleed easily.  Psychiatric/Behavioral: Negative for behavioral problems and confusion.      Allergies  Review of patient's allergies indicates no known allergies.  Home Medications   Prior to Admission medications   Medication Sig Start Date End Date Taking? Authorizing Provider  aspirin 81 MG tablet Take 81 mg by mouth daily.    Historical Provider, MD  atorvastatin (LIPITOR) 10 MG tablet  05/04/13   Historical Provider, MD  benazepril (LOTENSIN) 40 MG tablet Take 40 mg by mouth every morning.    Historical Provider, MD  meclizine (ANTIVERT) 50 MG tablet Take 1 tablet (50 mg total) by mouth 3 (three) times daily as needed for dizziness (take until 24 hours wihtout vertigo). 10/19/13   Tanna Furry, MD   BP 142/85  Pulse 88  Temp(Src) 98.7 F (37.1 C) (Oral)  Resp 18  Ht 6' (1.829 m)  Wt 275 lb (124.739 kg)  BMI 37.29 kg/m2  SpO2 98% Physical Exam  Constitutional:  He is oriented to person, place, and time. He appears well-developed and well-nourished. No distress.  HENT:  Head: Normocephalic.  Eyes: Conjunctivae are normal. Pupils are equal, round, and reactive to light. No scleral icterus.  Neck: Normal range of motion. Neck supple. No thyromegaly present.  Cardiovascular: Normal rate and regular rhythm.  Exam reveals no gallop and no friction rub.   No murmur heard. Pulmonary/Chest: Effort normal and breath  sounds normal. No respiratory distress. He has no wheezes. He has no rales.  Abdominal: Soft. Bowel sounds are normal. He exhibits no distension. There is no tenderness. There is no rebound.  Musculoskeletal: Normal range of motion.  Neurological: He is alert and oriented to person, place, and time.  Normal neurological exam. No cranial nerve abnormalities. No inducible nystagmus. Asymptomatic type movements. No carotid bruits. No pronator drift. Normal gait.  Skin: Skin is warm and dry. No rash noted.  Psychiatric: He has a normal mood and affect. His behavior is normal.    ED Course  Procedures (including critical care time) Labs Review Labs Reviewed - No data to display  Imaging Review No results found.   EKG Interpretation None      MDM   Final diagnoses:  Vertigo    Normal neurological exam. Only risk for vascular disorders:hypertension and age. Symptoms suggestive of acute peripheral vertigo/benign paroxysmal vertigo. Given meclizine. Prescription for meclizine. No driving for 24 hours without vertigo. Take the meclizine until 24 hours without vertigo    Tanna Furry, MD 10/19/13 [phone]

## 2014-01-01 DIAGNOSIS — D235 Other benign neoplasm of skin of trunk: Secondary | ICD-10-CM | POA: Diagnosis not present

## 2014-01-01 DIAGNOSIS — C4359 Malignant melanoma of other part of trunk: Secondary | ICD-10-CM | POA: Diagnosis not present

## 2014-01-01 DIAGNOSIS — D1801 Hemangioma of skin and subcutaneous tissue: Secondary | ICD-10-CM | POA: Diagnosis not present

## 2014-01-01 DIAGNOSIS — L570 Actinic keratosis: Secondary | ICD-10-CM | POA: Diagnosis not present

## 2014-01-19 DIAGNOSIS — Z23 Encounter for immunization: Secondary | ICD-10-CM | POA: Diagnosis not present

## 2014-04-07 DIAGNOSIS — M25561 Pain in right knee: Secondary | ICD-10-CM | POA: Diagnosis not present

## 2014-04-07 DIAGNOSIS — M25562 Pain in left knee: Secondary | ICD-10-CM | POA: Diagnosis not present

## 2014-04-07 DIAGNOSIS — I10 Essential (primary) hypertension: Secondary | ICD-10-CM | POA: Diagnosis not present

## 2014-04-07 DIAGNOSIS — E782 Mixed hyperlipidemia: Secondary | ICD-10-CM | POA: Diagnosis not present

## 2014-04-07 DIAGNOSIS — Z0000 Encounter for general adult medical examination without abnormal findings: Secondary | ICD-10-CM | POA: Diagnosis not present

## 2014-04-07 DIAGNOSIS — N402 Nodular prostate without lower urinary tract symptoms: Secondary | ICD-10-CM | POA: Diagnosis not present

## 2014-06-30 DIAGNOSIS — R972 Elevated prostate specific antigen [PSA]: Secondary | ICD-10-CM | POA: Diagnosis not present

## 2014-06-30 DIAGNOSIS — R351 Nocturia: Secondary | ICD-10-CM | POA: Diagnosis not present

## 2014-06-30 DIAGNOSIS — N401 Enlarged prostate with lower urinary tract symptoms: Secondary | ICD-10-CM | POA: Diagnosis not present

## 2014-08-25 DIAGNOSIS — H109 Unspecified conjunctivitis: Secondary | ICD-10-CM | POA: Diagnosis not present

## 2014-08-27 DIAGNOSIS — H16143 Punctate keratitis, bilateral: Secondary | ICD-10-CM | POA: Diagnosis not present

## 2014-11-16 DIAGNOSIS — Z0000 Encounter for general adult medical examination without abnormal findings: Secondary | ICD-10-CM | POA: Diagnosis not present

## 2014-11-16 DIAGNOSIS — N402 Nodular prostate without lower urinary tract symptoms: Secondary | ICD-10-CM | POA: Diagnosis not present

## 2014-11-16 DIAGNOSIS — E782 Mixed hyperlipidemia: Secondary | ICD-10-CM | POA: Diagnosis not present

## 2014-11-25 DIAGNOSIS — H16143 Punctate keratitis, bilateral: Secondary | ICD-10-CM | POA: Diagnosis not present

## 2014-12-23 DIAGNOSIS — H2513 Age-related nuclear cataract, bilateral: Secondary | ICD-10-CM | POA: Diagnosis not present

## 2014-12-27 DIAGNOSIS — L821 Other seborrheic keratosis: Secondary | ICD-10-CM | POA: Diagnosis not present

## 2014-12-27 DIAGNOSIS — Z85820 Personal history of malignant melanoma of skin: Secondary | ICD-10-CM | POA: Diagnosis not present

## 2014-12-27 DIAGNOSIS — L814 Other melanin hyperpigmentation: Secondary | ICD-10-CM | POA: Diagnosis not present

## 2014-12-27 DIAGNOSIS — Z08 Encounter for follow-up examination after completed treatment for malignant neoplasm: Secondary | ICD-10-CM | POA: Diagnosis not present

## 2015-01-19 DIAGNOSIS — I10 Essential (primary) hypertension: Secondary | ICD-10-CM | POA: Diagnosis not present

## 2015-01-19 DIAGNOSIS — E782 Mixed hyperlipidemia: Secondary | ICD-10-CM | POA: Diagnosis not present

## 2015-02-21 DIAGNOSIS — Z23 Encounter for immunization: Secondary | ICD-10-CM | POA: Diagnosis not present

## 2015-06-25 ENCOUNTER — Encounter (HOSPITAL_BASED_OUTPATIENT_CLINIC_OR_DEPARTMENT_OTHER): Payer: Self-pay | Admitting: *Deleted

## 2015-06-25 ENCOUNTER — Emergency Department (HOSPITAL_BASED_OUTPATIENT_CLINIC_OR_DEPARTMENT_OTHER)
Admission: EM | Admit: 2015-06-25 | Discharge: 2015-06-25 | Disposition: A | Discharge status: Home / Self Care | Payer: Medicare Other | Attending: Emergency Medicine | Admitting: Emergency Medicine

## 2015-06-25 DIAGNOSIS — E785 Hyperlipidemia, unspecified: Secondary | ICD-10-CM | POA: Diagnosis not present

## 2015-06-25 DIAGNOSIS — Z87442 Personal history of urinary calculi: Secondary | ICD-10-CM | POA: Diagnosis not present

## 2015-06-25 DIAGNOSIS — N390 Urinary tract infection, site not specified: Secondary | ICD-10-CM | POA: Diagnosis not present

## 2015-06-25 DIAGNOSIS — Z79899 Other long term (current) drug therapy: Secondary | ICD-10-CM | POA: Diagnosis not present

## 2015-06-25 DIAGNOSIS — R319 Hematuria, unspecified: Secondary | ICD-10-CM

## 2015-06-25 DIAGNOSIS — Z9889 Other specified postprocedural states: Secondary | ICD-10-CM | POA: Diagnosis not present

## 2015-06-25 DIAGNOSIS — I10 Essential (primary) hypertension: Secondary | ICD-10-CM | POA: Diagnosis not present

## 2015-06-25 DIAGNOSIS — Z8719 Personal history of other diseases of the digestive system: Secondary | ICD-10-CM | POA: Diagnosis not present

## 2015-06-25 DIAGNOSIS — Z7982 Long term (current) use of aspirin: Secondary | ICD-10-CM | POA: Insufficient documentation

## 2015-06-25 LAB — BASIC METABOLIC PANEL
Anion gap: 8 (ref 5–15)
BUN: 20 mg/dL (ref 6–20)
CALCIUM: 8.9 mg/dL (ref 8.9–10.3)
CO2: 26 mmol/L (ref 22–32)
CREATININE: 0.94 mg/dL (ref 0.61–1.24)
Chloride: 108 mmol/L (ref 101–111)
GFR calc Af Amer: >60 mL/min (ref >60)
GFR calc non Af Amer: >60 mL/min (ref >60)
GLUCOSE: 103 mg/dL — AB (ref 65–99)
Potassium: 4.1 mmol/L (ref 3.5–5.1)
Sodium: 142 mmol/L (ref 135–145)

## 2015-06-25 LAB — CBC
HCT: 42.8 % (ref 39.0–52.0)
Hemoglobin: 14.4 g/dL (ref 13.0–17.0)
MCH: 30.1 pg (ref 26.0–34.0)
MCHC: 33.6 g/dL (ref 30.0–36.0)
MCV: 89.4 fL (ref 78.0–100.0)
PLATELETS: 156 10*3/uL (ref 150–400)
RBC: 4.79 MIL/uL (ref 4.22–5.81)
RDW: 12.7 % (ref 11.5–15.5)
WBC: 6.2 10*3/uL (ref 4.0–10.5)

## 2015-06-25 LAB — URINALYSIS, ROUTINE W REFLEX MICROSCOPIC
BILIRUBIN URINE: NEGATIVE
GLUCOSE, UA: NEGATIVE mg/dL
KETONES UR: NEGATIVE mg/dL
Nitrite: NEGATIVE
PH: 5.5 (ref 5.0–8.0)
Protein, ur: 100 mg/dL — AB
Specific Gravity, Urine: 1.024 (ref 1.005–1.030)

## 2015-06-25 LAB — URINE MICROSCOPIC-ADD ON

## 2015-06-25 MED ORDER — CIPROFLOXACIN HCL 500 MG PO TABS
500.0000 mg | ORAL_TABLET | Freq: Once | ORAL | Status: AC
  Administered 2015-06-25: 500 mg via ORAL
  Filled 2015-06-25: qty 1

## 2015-06-25 MED ORDER — CIPROFLOXACIN HCL 500 MG PO TABS: 500.0000 mg | ORAL_TABLET | Freq: Two times a day (BID) | ORAL | Status: DC

## 2015-06-25 NOTE — ED Notes (Signed)
Removed PIV (20G) from R AC; catheter intact. Site unremarkable -- no redness, swelling, bruising.

## 2015-06-25 NOTE — ED Notes (Signed)
Patient is alert and oriented x4.  He is complaining of hematuria.  He states that he noticed today that his urine was  Significantly darker than usual.  He adds that yesterday he did have a fall and landed on his back.  Patient denies any  Pain or other symptoms.

## 2015-06-25 NOTE — ED Provider Notes (Signed)
CSN: [id number]     Arrival date & time 06/25/15  2015 History  By signing my name below, I, Jesus Yang, attest that this documentation has been prepared under the direction and in the presence of Alfonzo Beers, MD. Electronically Signed: [name], ED Scribe. 06/25/2015. 9:17 PM   Chief Complaint  Patient presents with  . Hematuria   Patient is a 75 y.o. male presenting with hematuria. The history is provided by the patient. No language interpreter was used.  Hematuria This is a new problem. The current episode started yesterday. The problem has not changed since onset.Pertinent negatives include no chest pain and no abdominal pain. Nothing aggravates the symptoms. Nothing relieves the symptoms. He has tried nothing for the symptoms.   Jesus Yang is a 75 y.o. male with history of kidney stones and TURP who presents to the Emergency Department complaining of increasing hematuria with onset yesterday. Pt notes that prior to the onset of symptoms he tripped and fell backwards from standing while doing yard work.  Pt denies back pain, fever, chills, abdominal pain, nausea, vomiting, dysuria, increased frequency, and increased urgency. No head injury or LOC.   Past Medical History  Diagnosis Date  . Kidney stone   . Hypertension   . Hyperlipidemia   . GERD (gastroesophageal reflux disease)    Past Surgical History  Procedure Laterality Date  . Kidney cyst removal  1980  . Replacement total knee    . Melanoma excision  2006    of chest  . Joint replacement      bilateral knee  . Green light laser turp (transurethral resection of prostate  04/18/2012    Procedure: GREEN LIGHT LASER TURP (TRANSURETHRAL RESECTION OF PROSTATE;  Surgeon: Ailene Rud, MD;  Location: [facility];  Service: Urology;  Laterality: N/A;   History reviewed. No pertinent family history. Social History  Substance Use Topics  . Smoking status: Never Smoker   . Smokeless tobacco: None  . Alcohol  Use: Yes     Comment: occassionally    Review of Systems  Constitutional: Negative for fever and chills.  Cardiovascular: Negative for chest pain.  Gastrointestinal: Negative for nausea, vomiting and abdominal pain.  Genitourinary: Positive for hematuria.  Musculoskeletal: Negative for back pain.  ROS reviewed and all otherwise negative except for mentioned in HPI  Allergies  Review of patient's allergies indicates no known allergies.  Home Medications   Prior to Admission medications   Medication Sig Start Date End Date Taking? Authorizing Provider  aspirin 81 MG tablet Take 81 mg by mouth daily.    Historical Provider, MD  atorvastatin (LIPITOR) 10 MG tablet  05/04/13   Historical Provider, MD  benazepril (LOTENSIN) 40 MG tablet Take 40 mg by mouth every morning.    Historical Provider, MD  ciprofloxacin (CIPRO) 500 MG tablet Take 1 tablet (500 mg total) by mouth every 12 (twelve) hours. 06/25/15   Alfonzo Beers, MD  meclizine (ANTIVERT) 50 MG tablet Take 1 tablet (50 mg total) by mouth 3 (three) times daily as needed for dizziness (take until 24 hours wihtout vertigo). 10/19/13   Tanna Furry, MD   BP 163/96 mmHg  Pulse 78  Temp(Src) 98.5 F (36.9 C) (Oral)  Resp 18  Ht 6' (1.829 m)  Wt 275 lb (124.739 kg)  BMI 37.29 kg/m2  SpO2 100% Vitals reviewed Physical Exam  Physical Examination: General appearance - alert, well appearing, and in no distress Mental status - alert, oriented to person, place,  and time Eyes -no conjunctival injection, no scleral icterus Mouth - mucous membranes moist, pharynx normal without lesions Chest - clear to auscultation, no wheezes, rales or rhonchi, symmetric air entry Heart - normal rate, regular rhythm, normal S1, S2, no murmurs, rubs, clicks or gallops Abdomen - soft, nontender, nondistended, no masses or organomegaly, nontender Back exam - full range of motion, no midline tenderness, no CVA tenderness, no bruising or abrasions Neurological -  alert, oriented, normal speech Extremities - peripheral pulses normal, no pedal edema, no clubbing or cyanosis Skin - normal coloration and turgor, no rashes  ED Course  Procedures (including critical care time) DIAGNOSTIC STUDIES: Oxygen Saturation is 100% on RA,  normal by my interpretation.    COORDINATION OF CARE: 9:16 PM Discussed treatment plan which includes lab work with pt at bedside and pt agreed to plan.  Labs Review Labs Reviewed  URINALYSIS, ROUTINE W REFLEX MICROSCOPIC (NOT AT [facility]) - Abnormal; Notable for the following:    Color, Urine RED (*)    APPearance CLOUDY (*)    Hgb urine dipstick LARGE (*)    Protein, ur 100 (*)    Leukocytes, UA TRACE (*)    All other components within normal limits  BASIC METABOLIC PANEL - Abnormal; Notable for the following:    Glucose, Bld 103 (*)    All other components within normal limits  URINE MICROSCOPIC-ADD ON - Abnormal; Notable for the following:    Squamous Epithelial / LPF 0-5 (*)    Bacteria, UA MANY (*)    All other components within normal limits  URINE CULTURE  CBC    Imaging Review No results found. I have personally reviewed and evaluated these lab results as part of my medical decision-making.   EKG Interpretation None      MDM   Final diagnoses:  Hematuria  UTI (lower urinary tract infection)    Pt presenting with c/o hematuria.  Had a fall yesterday - but no back or abdominal pain to suggest renal hematoma.  Urine has many bacteria- suspect UTI, urine culture sent- pt started on cipro.  Renal function is normal.  Pt will f/u with urology.  Discharged with strict return precautions.  Pt agreeable with plan.  I personally performed the services described in this documentation, which was scribed in my presence. The recorded information has been reviewed and is accurate.     Alfonzo Beers, MD 06/25/15 2250

## 2015-06-25 NOTE — Discharge Instructions (Signed)
Return to the ED with any concerns including abdominal pain, back pain, difficulty passing urine, fever/chills, vomiting and not able to keep down antibiotics, decreased level of alertness/lethargy, or any other alarming symptoms

## 2015-06-26 ENCOUNTER — Emergency Department (HOSPITAL_BASED_OUTPATIENT_CLINIC_OR_DEPARTMENT_OTHER)
Admission: EM | Admit: 2015-06-26 | Discharge: 2015-06-26 | Disposition: A | Discharge status: Home / Self Care | Payer: Medicare Other | Attending: Emergency Medicine | Admitting: Emergency Medicine

## 2015-06-26 ENCOUNTER — Encounter (HOSPITAL_BASED_OUTPATIENT_CLINIC_OR_DEPARTMENT_OTHER): Payer: Self-pay

## 2015-06-26 DIAGNOSIS — K439 Ventral hernia without obstruction or gangrene: Secondary | ICD-10-CM | POA: Insufficient documentation

## 2015-06-26 DIAGNOSIS — R319 Hematuria, unspecified: Secondary | ICD-10-CM

## 2015-06-26 DIAGNOSIS — Z87442 Personal history of urinary calculi: Secondary | ICD-10-CM | POA: Insufficient documentation

## 2015-06-26 DIAGNOSIS — R339 Retention of urine, unspecified: Secondary | ICD-10-CM | POA: Diagnosis not present

## 2015-06-26 DIAGNOSIS — Z79899 Other long term (current) drug therapy: Secondary | ICD-10-CM | POA: Insufficient documentation

## 2015-06-26 DIAGNOSIS — I10 Essential (primary) hypertension: Secondary | ICD-10-CM | POA: Insufficient documentation

## 2015-06-26 DIAGNOSIS — E785 Hyperlipidemia, unspecified: Secondary | ICD-10-CM | POA: Insufficient documentation

## 2015-06-26 DIAGNOSIS — Z7982 Long term (current) use of aspirin: Secondary | ICD-10-CM | POA: Insufficient documentation

## 2015-06-26 LAB — URINALYSIS, ROUTINE W REFLEX MICROSCOPIC
Bilirubin Urine: NEGATIVE
GLUCOSE, UA: 100 mg/dL — AB
Ketones, ur: NEGATIVE mg/dL
LEUKOCYTES UA: NEGATIVE
Nitrite: NEGATIVE
PH: 7.5 (ref 5.0–8.0)
Specific Gravity, Urine: 1.031 — ABNORMAL HIGH (ref 1.005–1.030)

## 2015-06-26 LAB — URINE MICROSCOPIC-ADD ON

## 2015-06-26 NOTE — ED Notes (Signed)
Pt voided scant amt of very bright red, appears to be frank blood into urinal, insufficient amt for UA

## 2015-06-26 NOTE — ED Notes (Signed)
Patient here with ongoing hematuria from visit yesterday and reports increased blood clots and 1 episode of urinary retention last pm. Patient diagnosed with hematuria and uti yesterday and taking antibiotic as directed

## 2015-06-26 NOTE — ED Notes (Signed)
Pt was seen in this ED yesterday and dx with UTI per EDP, given Rx for Cipro 500mg  PO q12 hours. Presents today with difficulty voiding, having urgency, states had to strain in order for urine to pass, then noted "alot of bright red blood", at approx 0800 voided again, again had to void at 1000hrs, noted had difficulty again passing urine and noted bright red blood in urine, returns for re-evaluation.

## 2015-06-26 NOTE — ED Provider Notes (Signed)
CSN: [id number]     Arrival date & time 06/26/15  1133 History   First MD Initiated Contact with Patient 06/26/15 1240     Chief Complaint  Patient presents with  . Urinary Retention  . Hematuria     (Consider location/radiation/quality/duration/timing/severity/associated sxs/prior Treatment) HPI Patient fell while doing yard work 2 days ago landing on his right upper back. He presented here yesterday complaining of hematuria. He was prescribed Cipro and urine sent for culture. Today he presents as he's noted clots in his urine and has had some difficulty in getting his urinary stream started though feels that he is able to empty his bladder. Denies pain anywhere. No fever. No other associated symptoms. Nothing makes symptoms better or worse Past Medical History  Diagnosis Date  . Kidney stone   . Hypertension   . Hyperlipidemia   . GERD (gastroesophageal reflux disease)    Past Surgical History  Procedure Laterality Date  . Kidney cyst removal  1980  . Replacement total knee    . Melanoma excision  2006    of chest  . Joint replacement      bilateral knee  . Green light laser turp (transurethral resection of prostate  04/18/2012    Procedure: GREEN LIGHT LASER TURP (TRANSURETHRAL RESECTION OF PROSTATE;  Surgeon: Ailene Rud, MD;  Location: [facility];  Service: Urology;  Laterality: N/A;   No family history on file. Social History  Substance Use Topics  . Smoking status: Never Smoker   . Smokeless tobacco: None  . Alcohol Use: Yes     Comment: occassionally    Review of Systems  Constitutional: Negative.   HENT: Negative.   Respiratory: Negative.   Cardiovascular: Negative.   Gastrointestinal: Negative.   Genitourinary: Positive for dysuria and hematuria.  Musculoskeletal: Negative.   Skin: Negative.   Neurological: Negative.   Psychiatric/Behavioral: Negative.   All other systems reviewed and are negative.     Allergies  Review of patient's allergies  indicates no known allergies.  Home Medications   Prior to Admission medications   Medication Sig Start Date End Date Taking? Authorizing Provider  aspirin 81 MG tablet Take 81 mg by mouth daily.    Historical Provider, MD  atorvastatin (LIPITOR) 10 MG tablet  05/04/13   Historical Provider, MD  benazepril (LOTENSIN) 40 MG tablet Take 40 mg by mouth every morning.    Historical Provider, MD  ciprofloxacin (CIPRO) 500 MG tablet Take 1 tablet (500 mg total) by mouth every 12 (twelve) hours. 06/25/15   Alfonzo Beers, MD  meclizine (ANTIVERT) 50 MG tablet Take 1 tablet (50 mg total) by mouth 3 (three) times daily as needed for dizziness (take until 24 hours wihtout vertigo). 10/19/13   Tanna Furry, MD   BP 156/67 mmHg  Pulse 70  Temp(Src) 98.2 F (36.8 C) (Oral)  Resp 18  Ht 6' (1.829 m)  Wt 275 lb (124.739 kg)  BMI 37.29 kg/m2  SpO2 96% Physical Exam  Constitutional: He appears well-developed and well-nourished.  HENT:  Head: Normocephalic and atraumatic.  Eyes: Conjunctivae are normal. Pupils are equal, round, and reactive to light.  Neck: Neck supple. No tracheal deviation present. No thyromegaly present.  Cardiovascular: Normal rate and regular rhythm.   No murmur heard. Pulmonary/Chest: Effort normal and breath sounds normal.  Abdominal: Soft. Bowel sounds are normal. He exhibits no distension. There is no tenderness.  Ventral hernia, reduces spontaneously. Obese  Genitourinary: Penis normal.  Musculoskeletal: Normal range of motion. He  exhibits no edema or tenderness.  Neurological: He is alert. Coordination normal.  Skin: Skin is warm and dry. No rash noted.  Psychiatric: He has a normal mood and affect.  Nursing note and vitals reviewed.   ED Course  Procedures (including critical care time) Labs Review Labs Reviewed - No data to display  Imaging Review No results found. I have personally reviewed and evaluated these images and lab results as part of my medical  decision-making.   EKG Interpretation None     Post void residual equals 0 based on bladder scan Results for orders placed or performed during the hospital encounter of 06/26/15  Urinalysis, Routine w reflex microscopic (not at [facility])  Result Value Ref Range   Color, Urine YELLOW YELLOW   APPearance TURBID (A) CLEAR   Specific Gravity, Urine 1.031 (H) 1.005 - 1.030   pH 7.5 5.0 - 8.0   Glucose, UA 100 (A) NEGATIVE mg/dL   Hgb urine dipstick LARGE (A) NEGATIVE   Bilirubin Urine NEGATIVE NEGATIVE   Ketones, ur NEGATIVE NEGATIVE mg/dL   Protein, ur >300 (A) NEGATIVE mg/dL   Nitrite NEGATIVE NEGATIVE   Leukocytes, UA NEGATIVE NEGATIVE  Urine microscopic-add on  Result Value Ref Range   Squamous Epithelial / LPF 0-5 (A) NONE SEEN   WBC, UA 0-5 0 - 5 WBC/hpf   RBC / HPF TOO NUMEROUS TO COUNT 0 - 5 RBC/hpf   Bacteria, UA MANY (A) NONE SEEN   Urine-Other URINALYSIS PERFORMED ON SUPERNATANT    No results found.  MDM  Discussed with patient that he may need a Foley catheter if he does into urinary retention. Since he is not retaining urine presently will not insert Foley He is instructed to call Dr.Tannenbaum tomorrow to schedule the next available follow-up appointment. Urine culture pending from yesterday. Diagnosis hematuria Final diagnoses:  None        Orlie Dakin, MD 06/26/15 1435

## 2015-06-26 NOTE — ED Notes (Signed)
Patient denies pain and is resting comfortably.  

## 2015-06-26 NOTE — ED Notes (Signed)
DC instructions reviewed with pt from EDP orders, pt understood to contact MD at [facility] in [place] in AM to have follow up visit. Opportunity for questions provided

## 2015-06-26 NOTE — Discharge Instructions (Signed)
Hematuria, Adult Call Dr. Gaynelle Arabian tomorrow to schedule next available office appointment with him or with any of his partners Hematuria is blood in your urine. It can be caused by a bladder infection, kidney infection, prostate infection, kidney stone, or cancer of your urinary tract. Infections can usually be treated with medicine, and a kidney stone usually will pass through your urine. If neither of these is the cause of your hematuria, further workup to find out the reason may be needed. It is very important that you tell your health care provider about any blood you see in your urine, even if the blood stops without treatment or happens without causing pain. Blood in your urine that happens and then stops and then happens again can be a symptom of a very serious condition. Also, pain is not a symptom in the initial stages of many urinary cancers. HOME CARE INSTRUCTIONS   Drink lots of fluid, 3-4 quarts a day. If you have been diagnosed with an infection, cranberry juice is especially recommended, in addition to large amounts of water.  Avoid caffeine, tea, and carbonated beverages because they tend to irritate the bladder.  Avoid alcohol because it may irritate the prostate.  Take all medicines as directed by your health care provider.  If you were prescribed an antibiotic medicine, finish it all even if you start to feel better.  If you have been diagnosed with a kidney stone, follow your health care provider's instructions regarding straining your urine to catch the stone.  Empty your bladder often. Avoid holding urine for long periods of time.  After a bowel movement, women should cleanse front to back. Use each tissue only once.  Empty your bladder before and after sexual intercourse if you are a male. SEEK MEDICAL CARE IF:  You develop back pain.  You have a fever.  You have a feeling of sickness in your stomach (nausea) or vomiting.  Your symptoms are not better in 3  days. Return sooner if you are getting worse. SEEK IMMEDIATE MEDICAL CARE IF:   You develop severe vomiting and are unable to keep the medicine down.  You develop severe back or abdominal pain despite taking your medicines.  You begin passing a large amount of blood or clots in your urine.  You feel extremely weak or faint, or you pass out. MAKE SURE YOU:   Understand these instructions.  Will watch your condition.  Will get help right away if you are not doing well or get worse.   This information is not intended to replace advice given to you by your health care provider. Make sure you discuss any questions you have with your health care provider.   Document Released: 05/14/2005 Document Revised: 06/04/2014 Document Reviewed: 01/12/2013 Elsevier Interactive Patient Education [employer].

## 2015-06-27 DIAGNOSIS — N401 Enlarged prostate with lower urinary tract symptoms: Secondary | ICD-10-CM | POA: Diagnosis not present

## 2015-06-27 DIAGNOSIS — R972 Elevated prostate specific antigen [PSA]: Secondary | ICD-10-CM | POA: Diagnosis not present

## 2015-06-27 DIAGNOSIS — R3129 Other microscopic hematuria: Secondary | ICD-10-CM | POA: Diagnosis not present

## 2015-06-27 DIAGNOSIS — Z0000 Encounter for general adult medical examination without abnormal findings: Secondary | ICD-10-CM | POA: Diagnosis not present

## 2015-06-27 DIAGNOSIS — R351 Nocturia: Secondary | ICD-10-CM | POA: Diagnosis not present

## 2015-06-27 DIAGNOSIS — R310 Gross hematuria: Secondary | ICD-10-CM | POA: Diagnosis not present

## 2015-06-27 LAB — URINE CULTURE: CULTURE: NO GROWTH

## 2015-06-29 ENCOUNTER — Other Ambulatory Visit (HOSPITAL_COMMUNITY): Payer: Self-pay | Admitting: Urology

## 2015-06-29 DIAGNOSIS — N289 Disorder of kidney and ureter, unspecified: Secondary | ICD-10-CM

## 2015-07-11 ENCOUNTER — Ambulatory Visit (HOSPITAL_COMMUNITY): Admission: RE | Admit: 2015-07-11 | Payer: Medicare Other | Source: Ambulatory Visit

## 2015-07-12 ENCOUNTER — Encounter: Payer: Self-pay | Admitting: Family Medicine

## 2015-07-13 ENCOUNTER — Ambulatory Visit (INDEPENDENT_AMBULATORY_CARE_PROVIDER_SITE_OTHER): Payer: Medicare Other | Admitting: Family Medicine

## 2015-07-13 ENCOUNTER — Encounter: Payer: Self-pay | Admitting: Family Medicine

## 2015-07-13 VITALS — BP 125/83 | HR 71 | Temp 98.7°F | Resp 20 | Ht 72.0 in | Wt 281.5 lb

## 2015-07-13 DIAGNOSIS — Z23 Encounter for immunization: Secondary | ICD-10-CM | POA: Diagnosis not present

## 2015-07-13 DIAGNOSIS — R319 Hematuria, unspecified: Secondary | ICD-10-CM | POA: Diagnosis not present

## 2015-07-13 DIAGNOSIS — Z7189 Other specified counseling: Secondary | ICD-10-CM

## 2015-07-13 DIAGNOSIS — I10 Essential (primary) hypertension: Secondary | ICD-10-CM | POA: Diagnosis not present

## 2015-07-13 DIAGNOSIS — Z7689 Persons encountering health services in other specified circumstances: Secondary | ICD-10-CM

## 2015-07-13 DIAGNOSIS — E785 Hyperlipidemia, unspecified: Secondary | ICD-10-CM

## 2015-07-13 MED ORDER — BENAZEPRIL HCL 40 MG PO TABS: 40.0000 mg | ORAL_TABLET | Freq: Every morning | ORAL | Status: DC

## 2015-07-13 NOTE — Patient Instructions (Signed)

## 2015-07-13 NOTE — Progress Notes (Addendum)
Patient ID: Jesus Yang, male   DOB: 1940-07-29, 75 y.o.   MRN: [id number]      Patient ID: Jesus Yang, male  DOB: 1940-08-27, 75 y.o.   MRN: [id number]  Subjective:  Jesus Yang is a 75 y.o. male present for establishment of care. All past medical history, surgical history, allergies, family history, immunizations, medications and social history were obtained and entered in the electronic medical record today. All recent labs, ED visits and hospitalizations within the last year were reviewed.  Patient is prior PCP was Dr. Greta Doom.  Hematuria: Of note patient reports he had an episode 2 weeks ago when he was coming out of the weights he sustained a fall. He states he just tripped over brushing. He started urinating blood, and was seen in the emergency room. He was then followed with urology. They believe he "dislodged a kidney stone "by patient's reports. He states that the hematuria has stopped. He has had a cystoscope which he states they said they did not find any concerning lesions, but had a few "blips "that has the urologist mildly concerned and he has a MRI study to be completed within the near future. Patient has appropriate follow-up for this condition, he just wanted to make this provider where since it is a new problem for him.  Health maintenance:  Colonoscopy:2014, follow-up in 10 years. Immunizations: Tdap indicated, flu shot up-to-date, PNA23 and Zostavax up-to-date, prevnar 13 indicated.  Infectious disease screening: Unknown PSA: Followed by urology, BPH  Past Medical History  Diagnosis Date  . Kidney stone   . Hypertension   . Hyperlipidemia   . GERD (gastroesophageal reflux disease)   . BPH (benign prostatic hyperplasia)     follows with urology  . Melanoma ([place])   . Arthralgia     bilateral knees  . History of EKG     incomplete RBBB   No Known Allergies Past Surgical History  Procedure Laterality Date  . Kidney cyst removal  1980  . Replacement  total knee    . Melanoma excision  2006    of chest  . Joint replacement      bilateral knee  . Green light laser turp (transurethral resection of prostate  04/18/2012    Procedure: GREEN LIGHT LASER TURP (TRANSURETHRAL RESECTION OF PROSTATE;  Surgeon: Ailene Rud, MD;  Location: [facility];  Service: Urology;  Laterality: N/A;   Family History  Problem Relation Age of Onset  . Melanoma Father   . Colon cancer Neg Hx    Social History   Social History  . Marital Status: Married    Spouse Name: N/A  . Number of Children: N/A  . Years of Education: N/A   Occupational History  . Not on file.   Social History Main Topics  . Smoking status: Never Smoker   . Smokeless tobacco: Never Used  . Alcohol Use: Yes     Comment: occassionally  . Drug Use: No  . Sexual Activity: Yes   Other Topics Concern  . Not on file   Social History Narrative   Retired from TEFL teacher. Married. 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    Caffeinated beverages.   Wears his seatbelt, wears a bicycle helmet, exercises at least 3 times a week.   Takes a daily vitamin.   There is a smoke alarm in the home.   There are firearms in the home, locked.           ROS: Negative,  with the exception of above mentioned in HPI  Objective: BP 125/83 mmHg  Pulse 71  Temp(Src) 98.7 F (37.1 C)  Resp 20  Ht 6' (1.829 m)  Wt 281 lb 8 oz (127.688 kg)  BMI 38.17 kg/m2  SpO2 97% Gen: Afebrile. No acute distress. Nontoxic in appearance, well-developed, well-nourished, Caucasian male. HENT: AT. [place]. Bilateral TM visualized and normal in appearance, normal external auditory canal. MMM, no oral lesion.  Bilateral nares within normal limits. Throat without erythema, ulcerations or exudates.  Eyes:Pupils Equal Round Reactive to light, Extraocular movements intact,  Conjunctiva without redness, discharge or icterus. Neck/lymp/endocrine: Supple, no lymphadenopathy CV: RRR, no edema, +2/4 P posterior tibialis pulses.   Chest: CTAB, no wheeze, rhonchi or crackles. Abd: Soft. NTND. BS present  Skin: No rashes, purpura or petechiae. Warm and well-perfused. Skin intact. Neuro/Msk: Normal gait. PERLA. EOMi. Alert. Oriented x3.   Psych: Normal affect, dress and demeanor. Normal speech. Normal thought content and judgment.  Assessment/plan: HUNT WALTZER is a 75 y.o. male  present for establishment of care 1. Need for prophylactic vaccination with combined diphtheria-tetanus-pertussis (DTP) vaccine - Tdap vaccine greater than or equal to 7yo IM  2. Hematuria - Continue to follow with urology.  3. Essential hypertension, benign - Stable - Continue current regimen, refills on Benzapril provided today. - Continue daily baby aspirin - Follow-up in 6 months.  4. Hyperlipidemia - Continue atorvastatin, patient desiring to come off medication if possible on next lipid check. He does not need refills on this medication today. - Ask [place] diet and exercise with patient today.  - Follow-up in 6 months on chronic issues, or complete physical scheduled sooner.    Howard Pouch, DO [place]

## 2015-07-14 ENCOUNTER — Encounter: Payer: Self-pay | Admitting: Family Medicine

## 2015-07-14 DIAGNOSIS — R319 Hematuria, unspecified: Secondary | ICD-10-CM | POA: Insufficient documentation

## 2015-07-14 DIAGNOSIS — I10 Essential (primary) hypertension: Secondary | ICD-10-CM | POA: Insufficient documentation

## 2015-07-14 DIAGNOSIS — E785 Hyperlipidemia, unspecified: Secondary | ICD-10-CM | POA: Insufficient documentation

## 2015-07-15 ENCOUNTER — Encounter: Payer: Self-pay | Admitting: Family Medicine

## 2015-07-17 ENCOUNTER — Encounter: Payer: Self-pay | Admitting: Family Medicine

## 2015-07-18 ENCOUNTER — Ambulatory Visit (HOSPITAL_COMMUNITY)
Admission: RE | Admit: 2015-07-18 | Discharge: 2015-07-18 | Disposition: A | Discharge status: Home / Self Care | Payer: Medicare Other | Source: Ambulatory Visit | Attending: Urology | Admitting: Urology

## 2015-07-18 ENCOUNTER — Other Ambulatory Visit (HOSPITAL_COMMUNITY): Payer: Self-pay | Admitting: Urology

## 2015-07-18 ENCOUNTER — Other Ambulatory Visit: Payer: Self-pay | Admitting: Urology

## 2015-07-18 ENCOUNTER — Ambulatory Visit (HOSPITAL_COMMUNITY): Admission: RE | Admit: 2015-07-18 | Payer: Medicare Other | Source: Ambulatory Visit

## 2015-07-18 DIAGNOSIS — N289 Disorder of kidney and ureter, unspecified: Secondary | ICD-10-CM

## 2015-07-28 ENCOUNTER — Ambulatory Visit
Admission: RE | Admit: 2015-07-28 | Discharge: 2015-07-28 | Disposition: A | Discharge status: Home / Self Care | Payer: Medicare Other | Source: Ambulatory Visit | Attending: Urology | Admitting: Urology

## 2015-07-28 DIAGNOSIS — N289 Disorder of kidney and ureter, unspecified: Secondary | ICD-10-CM

## 2015-08-02 DIAGNOSIS — N401 Enlarged prostate with lower urinary tract symptoms: Secondary | ICD-10-CM | POA: Diagnosis not present

## 2015-08-02 DIAGNOSIS — R972 Elevated prostate specific antigen [PSA]: Secondary | ICD-10-CM | POA: Diagnosis not present

## 2015-08-02 DIAGNOSIS — Z0000 Encounter for general adult medical examination without abnormal findings: Secondary | ICD-10-CM | POA: Diagnosis not present

## 2015-08-02 DIAGNOSIS — D4101 Neoplasm of uncertain behavior of right kidney: Secondary | ICD-10-CM | POA: Diagnosis not present

## 2015-08-02 DIAGNOSIS — R351 Nocturia: Secondary | ICD-10-CM | POA: Diagnosis not present

## 2015-08-03 ENCOUNTER — Ambulatory Visit
Admission: RE | Admit: 2015-08-03 | Discharge: 2015-08-03 | Disposition: A | Discharge status: Home / Self Care | Payer: Medicare Other | Source: Ambulatory Visit | Attending: Urology | Admitting: Urology

## 2015-08-03 ENCOUNTER — Other Ambulatory Visit: Payer: Self-pay | Admitting: Urology

## 2015-08-03 ENCOUNTER — Ambulatory Visit: Admission: RE | Admit: 2015-08-03 | Payer: Medicare Other | Source: Ambulatory Visit

## 2015-08-03 DIAGNOSIS — R319 Hematuria, unspecified: Secondary | ICD-10-CM | POA: Diagnosis not present

## 2015-08-03 DIAGNOSIS — N289 Disorder of kidney and ureter, unspecified: Secondary | ICD-10-CM

## 2015-08-03 MED ORDER — GADOBENATE DIMEGLUMINE 529 MG/ML IV SOLN
20.0000 mL | Freq: Once | INTRAVENOUS | Status: AC | PRN
  Administered 2015-08-03: 20 mL via INTRAVENOUS

## 2015-08-29 DIAGNOSIS — N281 Cyst of kidney, acquired: Secondary | ICD-10-CM | POA: Diagnosis not present

## 2015-08-29 DIAGNOSIS — R310 Gross hematuria: Secondary | ICD-10-CM | POA: Diagnosis not present

## 2015-08-29 DIAGNOSIS — R972 Elevated prostate specific antigen [PSA]: Secondary | ICD-10-CM | POA: Diagnosis not present

## 2015-08-29 DIAGNOSIS — D49511 Neoplasm of unspecified behavior of right kidney: Secondary | ICD-10-CM | POA: Diagnosis not present

## 2015-08-29 DIAGNOSIS — Z0000 Encounter for general adult medical examination without abnormal findings: Secondary | ICD-10-CM | POA: Diagnosis not present

## 2015-08-29 DIAGNOSIS — N401 Enlarged prostate with lower urinary tract symptoms: Secondary | ICD-10-CM | POA: Diagnosis not present

## 2015-08-30 ENCOUNTER — Encounter: Payer: Self-pay | Admitting: Family Medicine

## 2015-08-30 DIAGNOSIS — N138 Other obstructive and reflux uropathy: Secondary | ICD-10-CM | POA: Insufficient documentation

## 2015-08-30 DIAGNOSIS — N401 Enlarged prostate with lower urinary tract symptoms: Secondary | ICD-10-CM

## 2015-08-30 DIAGNOSIS — Z85528 Personal history of other malignant neoplasm of kidney: Secondary | ICD-10-CM | POA: Insufficient documentation

## 2015-08-31 ENCOUNTER — Other Ambulatory Visit: Payer: Self-pay | Admitting: Urology

## 2015-10-17 ENCOUNTER — Encounter (HOSPITAL_COMMUNITY): Payer: Self-pay

## 2015-10-17 ENCOUNTER — Encounter (HOSPITAL_COMMUNITY)
Admission: RE | Admit: 2015-10-17 | Discharge: 2015-10-17 | Disposition: A | Discharge status: Home / Self Care | Payer: Medicare Other | Source: Ambulatory Visit | Attending: Urology | Admitting: Urology

## 2015-10-17 DIAGNOSIS — N2889 Other specified disorders of kidney and ureter: Secondary | ICD-10-CM | POA: Insufficient documentation

## 2015-10-17 DIAGNOSIS — Z01812 Encounter for preprocedural laboratory examination: Secondary | ICD-10-CM | POA: Insufficient documentation

## 2015-10-17 DIAGNOSIS — I517 Cardiomegaly: Secondary | ICD-10-CM | POA: Insufficient documentation

## 2015-10-17 DIAGNOSIS — Z01810 Encounter for preprocedural cardiovascular examination: Secondary | ICD-10-CM | POA: Insufficient documentation

## 2015-10-17 DIAGNOSIS — I10 Essential (primary) hypertension: Secondary | ICD-10-CM | POA: Diagnosis not present

## 2015-10-17 LAB — BASIC METABOLIC PANEL
Anion gap: 6 (ref 5–15)
BUN: 24 mg/dL — AB (ref 6–20)
CO2: 26 mmol/L (ref 22–32)
CREATININE: 0.94 mg/dL (ref 0.61–1.24)
Calcium: 8.9 mg/dL (ref 8.9–10.3)
Chloride: 107 mmol/L (ref 101–111)
GFR calc Af Amer: >60 mL/min (ref >60)
GLUCOSE: 103 mg/dL — AB (ref 65–99)
POTASSIUM: 4.6 mmol/L (ref 3.5–5.1)
SODIUM: 139 mmol/L (ref 135–145)

## 2015-10-17 LAB — CBC
HEMATOCRIT: 41.5 % (ref 39.0–52.0)
Hemoglobin: 14.6 g/dL (ref 13.0–17.0)
MCH: 30.9 pg (ref 26.0–34.0)
MCHC: 35.2 g/dL (ref 30.0–36.0)
MCV: 87.7 fL (ref 78.0–100.0)
PLATELETS: 139 10*3/uL — AB (ref 150–400)
RBC: 4.73 MIL/uL (ref 4.22–5.81)
RDW: 12.4 % (ref 11.5–15.5)
WBC: 5.2 10*3/uL (ref 4.0–10.5)

## 2015-10-17 LAB — ABO/RH: ABO/RH(D): A POS

## 2015-10-17 NOTE — Patient Instructions (Signed)
Jesus Yang [facility]  10/17/2015   Your procedure is scheduled on: 10/21/15  Report to [facility] Main  Entrance take [facility]  elevators to 3rd floor to  [place] at 9:15 AM.  Call this number if you have problems the morning of surgery [phone]   Remember: ONLY 1 PERSON MAY GO WITH YOU TO SHORT STAY TO GET  READY MORNING OF Jesus Yang.  Do not eat food or drink liquids :After Midnight Thursday     Take these medicines the morning of surgery with A SIP OF WATER: Lipitor                                             Do not wear jewelry, lotions, powders or perfumes, deodorant                          Men may shave face and neck.   Do not bring valuables to the hospital. [name].  Contacts, dentures or bridgework may not be worn into surgery.  Leave suitcase in the car. After surgery it may be brought to your room.                  _____________________________________________________________________             [facility] - Preparing for Surgery Before surgery, you can play an important role.  Because skin is not sterile, your skin needs to be as free of germs as possible.  You can reduce the number of germs on your skin by washing with CHG (chlorahexidine gluconate) soap before surgery.  CHG is an antiseptic cleaner which kills germs and bonds with the skin to continue killing germs even after washing. Please DO NOT use if you have an allergy to CHG or antibacterial soaps.  If your skin becomes reddened/irritated stop using the CHG and inform your nurse when you arrive at Short Stay. Do not shave (including legs and underarms) for at least 48 hours prior to the first CHG shower.  You may shave your face/neck. Please follow these instructions carefully:  1.  Shower with CHG Soap the night before surgery and the  morning of Surgery.  2.  If you choose to wash your hair, wash your hair first as  usual with your  normal  shampoo.  3.  After you shampoo, rinse your hair and body thoroughly to remove the  shampoo.                           4.  Use CHG as you would any other liquid soap.  You can apply chg directly  to the skin and wash                       Gently with a scrungie or clean washcloth.  5.  Apply the CHG Soap to your body ONLY FROM THE NECK DOWN.   Do not use on face/ open  Wound or open sores. Avoid contact with eyes, ears mouth and genitals (private parts).                       Wash face,  Genitals (private parts) with your normal soap.             6.  Wash thoroughly, paying special attention to the area where your surgery  will be performed.  7.  Thoroughly rinse your body with warm water from the neck down.  8.  DO NOT shower/wash with your normal soap after using and rinsing off  the CHG Soap.                9.  Pat yourself dry with a clean towel.            10.  Wear clean pajamas.            11.  Place clean sheets on your bed the night of your first shower and do not  sleep with pets. Day of Surgery : Do not apply any lotions/deodorants the morning of surgery.  Please wear clean clothes to the hospital/surgery center.  FAILURE TO FOLLOW THESE INSTRUCTIONS MAY RESULT IN THE CANCELLATION OF YOUR SURGERY PATIENT SIGNATURE_________________________________  NURSE SIGNATURE__________________________________  ________________________________________________________________________

## 2015-10-17 NOTE — Pre-Procedure Instructions (Signed)
Lab results routed to Dr. Tresa Moore

## 2015-10-17 NOTE — Pre-Procedure Instructions (Signed)
EKG done 10/17/15- report shown to [place], MDA

## 2015-10-20 MED ORDER — DEXTROSE 5 % IV SOLN
3.0000 g | INTRAVENOUS | Status: AC
  Administered 2015-10-21: 3 g via INTRAVENOUS
  Filled 2015-10-20: qty 3

## 2015-10-21 ENCOUNTER — Inpatient Hospital Stay (HOSPITAL_COMMUNITY): Payer: Medicare Other | Admitting: Certified Registered Nurse Anesthetist

## 2015-10-21 ENCOUNTER — Encounter (HOSPITAL_COMMUNITY): Payer: Self-pay | Admitting: *Deleted

## 2015-10-21 ENCOUNTER — Inpatient Hospital Stay (HOSPITAL_COMMUNITY)
Admission: RE | Admit: 2015-10-21 | Discharge: 2015-10-22 | DRG: 658 | Disposition: A | Discharge status: Home / Self Care | Payer: Medicare Other | Source: Ambulatory Visit | Attending: Urology | Admitting: Urology

## 2015-10-21 ENCOUNTER — Encounter (HOSPITAL_COMMUNITY)
Admission: RE | Disposition: A | Discharge status: Home / Self Care | Payer: Self-pay | Source: Ambulatory Visit | Attending: Urology

## 2015-10-21 DIAGNOSIS — Z87442 Personal history of urinary calculi: Secondary | ICD-10-CM | POA: Diagnosis not present

## 2015-10-21 DIAGNOSIS — I10 Essential (primary) hypertension: Secondary | ICD-10-CM | POA: Diagnosis present

## 2015-10-21 DIAGNOSIS — N189 Chronic kidney disease, unspecified: Secondary | ICD-10-CM | POA: Diagnosis not present

## 2015-10-21 DIAGNOSIS — C641 Malignant neoplasm of right kidney, except renal pelvis: Principal | ICD-10-CM | POA: Diagnosis present

## 2015-10-21 DIAGNOSIS — Z96653 Presence of artificial knee joint, bilateral: Secondary | ICD-10-CM | POA: Diagnosis present

## 2015-10-21 DIAGNOSIS — E785 Hyperlipidemia, unspecified: Secondary | ICD-10-CM | POA: Diagnosis not present

## 2015-10-21 DIAGNOSIS — Z808 Family history of malignant neoplasm of other organs or systems: Secondary | ICD-10-CM

## 2015-10-21 DIAGNOSIS — N2889 Other specified disorders of kidney and ureter: Secondary | ICD-10-CM | POA: Diagnosis not present

## 2015-10-21 DIAGNOSIS — I4510 Unspecified right bundle-branch block: Secondary | ICD-10-CM | POA: Diagnosis not present

## 2015-10-21 DIAGNOSIS — K219 Gastro-esophageal reflux disease without esophagitis: Secondary | ICD-10-CM | POA: Diagnosis present

## 2015-10-21 DIAGNOSIS — N400 Enlarged prostate without lower urinary tract symptoms: Secondary | ICD-10-CM | POA: Diagnosis present

## 2015-10-21 DIAGNOSIS — I129 Hypertensive chronic kidney disease with stage 1 through stage 4 chronic kidney disease, or unspecified chronic kidney disease: Secondary | ICD-10-CM | POA: Diagnosis not present

## 2015-10-21 DIAGNOSIS — Z85820 Personal history of malignant melanoma of skin: Secondary | ICD-10-CM

## 2015-10-21 HISTORY — PX: ROBOTIC ASSITED PARTIAL NEPHRECTOMY: SHX6087

## 2015-10-21 LAB — HEMOGLOBIN AND HEMATOCRIT, BLOOD
HEMATOCRIT: 41.3 % (ref 39.0–52.0)
HEMOGLOBIN: 14.2 g/dL (ref 13.0–17.0)

## 2015-10-21 LAB — TYPE AND SCREEN
ABO/RH(D): A POS
Antibody Screen: NEGATIVE

## 2015-10-21 SURGERY — NEPHRECTOMY, PARTIAL, ROBOT-ASSISTED
Anesthesia: General | Site: Abdomen | Laterality: Right

## 2015-10-21 MED ORDER — SUGAMMADEX SODIUM 500 MG/5ML IV SOLN
INTRAVENOUS | Status: AC
  Filled 2015-10-21: qty 5

## 2015-10-21 MED ORDER — ROCURONIUM BROMIDE 100 MG/10ML IV SOLN
INTRAVENOUS | Status: DC | PRN
  Administered 2015-10-21 (×4): 10 mg via INTRAVENOUS
  Administered 2015-10-21: 50 mg via INTRAVENOUS
  Administered 2015-10-21: 10 mg via INTRAVENOUS

## 2015-10-21 MED ORDER — MEPERIDINE HCL 50 MG/ML IJ SOLN: 6.2500 mg | INTRAMUSCULAR | Status: DC | PRN

## 2015-10-21 MED ORDER — EPHEDRINE SULFATE 50 MG/ML IJ SOLN
INTRAMUSCULAR | Status: DC | PRN
  Administered 2015-10-21: 5 mg via INTRAVENOUS

## 2015-10-21 MED ORDER — ACETAMINOPHEN 10 MG/ML IV SOLN
1000.0000 mg | Freq: Once | INTRAVENOUS | Status: AC
  Administered 2015-10-21: 1000 mg via INTRAVENOUS

## 2015-10-21 MED ORDER — SODIUM CHLORIDE 0.9 % IJ SOLN
INTRAMUSCULAR | Status: AC
  Filled 2015-10-21: qty 20

## 2015-10-21 MED ORDER — DIPHENHYDRAMINE HCL 12.5 MG/5ML PO ELIX: 12.5000 mg | ORAL_SOLUTION | Freq: Four times a day (QID) | ORAL | Status: DC | PRN

## 2015-10-21 MED ORDER — FENTANYL CITRATE (PF) 100 MCG/2ML IJ SOLN
INTRAMUSCULAR | Status: DC | PRN
  Administered 2015-10-21: 50 ug via INTRAVENOUS
  Administered 2015-10-21: 25 ug via INTRAVENOUS
  Administered 2015-10-21 (×4): 50 ug via INTRAVENOUS
  Administered 2015-10-21: 25 ug via INTRAVENOUS
  Administered 2015-10-21: 50 ug via INTRAVENOUS

## 2015-10-21 MED ORDER — HYDRALAZINE HCL 20 MG/ML IJ SOLN: 5.0000 mg | INTRAMUSCULAR | Status: DC | PRN

## 2015-10-21 MED ORDER — BUPIVACAINE LIPOSOME 1.3 % IJ SUSP
INTRAMUSCULAR | Status: DC | PRN
  Administered 2015-10-21: 20 mL

## 2015-10-21 MED ORDER — LIDOCAINE HCL (CARDIAC) 20 MG/ML IV SOLN
INTRAVENOUS | Status: AC
  Filled 2015-10-21: qty 5

## 2015-10-21 MED ORDER — PROPOFOL 10 MG/ML IV BOLUS
INTRAVENOUS | Status: DC | PRN
  Administered 2015-10-21: 200 mg via INTRAVENOUS

## 2015-10-21 MED ORDER — HYDROMORPHONE HCL 1 MG/ML IJ SOLN: 0.2500 mg | INTRAMUSCULAR | Status: DC | PRN

## 2015-10-21 MED ORDER — ACETAMINOPHEN 10 MG/ML IV SOLN
INTRAVENOUS | Status: AC
  Filled 2015-10-21: qty 100

## 2015-10-21 MED ORDER — FENTANYL CITRATE (PF) 250 MCG/5ML IJ SOLN
INTRAMUSCULAR | Status: AC
  Filled 2015-10-21: qty 5

## 2015-10-21 MED ORDER — MANNITOL 25 % IV SOLN
INTRAVENOUS | Status: AC
  Filled 2015-10-21: qty 100

## 2015-10-21 MED ORDER — SUGAMMADEX SODIUM 200 MG/2ML IV SOLN
INTRAVENOUS | Status: DC | PRN
  Administered 2015-10-21: 300 mg via INTRAVENOUS

## 2015-10-21 MED ORDER — SUCCINYLCHOLINE CHLORIDE 20 MG/ML IJ SOLN
INTRAMUSCULAR | Status: DC | PRN
  Administered 2015-10-21: 100 mg via INTRAVENOUS

## 2015-10-21 MED ORDER — HYDROMORPHONE HCL 2 MG/ML IJ SOLN
INTRAMUSCULAR | Status: AC
  Filled 2015-10-21: qty 1

## 2015-10-21 MED ORDER — MIDAZOLAM HCL 2 MG/2ML IJ SOLN
INTRAMUSCULAR | Status: AC
Start: 2015-10-21 — End: 2015-10-21
  Filled 2015-10-21: qty 2

## 2015-10-21 MED ORDER — BUPIVACAINE LIPOSOME 1.3 % IJ SUSP
20.0000 mL | Freq: Once | INTRAMUSCULAR | Status: DC
  Filled 2015-10-21: qty 20

## 2015-10-21 MED ORDER — DEXAMETHASONE SODIUM PHOSPHATE 10 MG/ML IJ SOLN
INTRAMUSCULAR | Status: DC | PRN
  Administered 2015-10-21: 10 mg via INTRAVENOUS

## 2015-10-21 MED ORDER — ROCURONIUM BROMIDE 100 MG/10ML IV SOLN
INTRAVENOUS | Status: AC
  Filled 2015-10-21: qty 1

## 2015-10-21 MED ORDER — LACTATED RINGERS IV SOLN
INTRAVENOUS | Status: DC
Start: 2015-10-21 — End: 2015-10-21

## 2015-10-21 MED ORDER — ONDANSETRON HCL 4 MG/2ML IJ SOLN
INTRAMUSCULAR | Status: AC
  Filled 2015-10-21: qty 2

## 2015-10-21 MED ORDER — MANNITOL 25 % IV SOLN
25.0000 g | Freq: Once | INTRAVENOUS | Status: AC
  Administered 2015-10-21 (×2): 12.5 g via INTRAVENOUS

## 2015-10-21 MED ORDER — DEXAMETHASONE SODIUM PHOSPHATE 10 MG/ML IJ SOLN
INTRAMUSCULAR | Status: AC
  Filled 2015-10-21: qty 1

## 2015-10-21 MED ORDER — STERILE WATER FOR IRRIGATION IR SOLN
Status: DC | PRN
  Administered 2015-10-21: 1000 mL

## 2015-10-21 MED ORDER — OXYCODONE HCL 5 MG PO TABS
5.0000 mg | ORAL_TABLET | ORAL | Status: DC | PRN
Start: 2015-10-21 — End: 2015-10-22
  Administered 2015-10-22: 5 mg via ORAL
  Filled 2015-10-21: qty 1

## 2015-10-21 MED ORDER — SENNOSIDES-DOCUSATE SODIUM 8.6-50 MG PO TABS
1.0000 | ORAL_TABLET | Freq: Two times a day (BID) | ORAL | Status: DC
  Administered 2015-10-22 (×2): 1 via ORAL
  Filled 2015-10-21 (×2): qty 1

## 2015-10-21 MED ORDER — LACTATED RINGERS IV SOLN
INTRAVENOUS | Status: DC | PRN
  Administered 2015-10-21: 12:00:00 via INTRAVENOUS

## 2015-10-21 MED ORDER — HYDROMORPHONE HCL 1 MG/ML IJ SOLN
0.5000 mg | INTRAMUSCULAR | Status: DC | PRN
  Administered 2015-10-21: 1 mg via INTRAVENOUS
  Filled 2015-10-21: qty 1

## 2015-10-21 MED ORDER — SODIUM CHLORIDE 0.9 % IJ SOLN
INTRAMUSCULAR | Status: DC | PRN
  Administered 2015-10-21: 30 mL

## 2015-10-21 MED ORDER — HYDROMORPHONE HCL 1 MG/ML IJ SOLN
INTRAMUSCULAR | Status: DC | PRN
  Administered 2015-10-21 (×2): .4 mg via INTRAVENOUS

## 2015-10-21 MED ORDER — LACTATED RINGERS IV SOLN
INTRAVENOUS | Status: DC | PRN
  Administered 2015-10-21 (×2): via INTRAVENOUS

## 2015-10-21 MED ORDER — SODIUM CHLORIDE 0.9 % IJ SOLN
INTRAMUSCULAR | Status: AC
  Filled 2015-10-21: qty 10

## 2015-10-21 MED ORDER — LIDOCAINE HCL (PF) 2 % IJ SOLN
INTRAMUSCULAR | Status: DC | PRN
Start: 2015-10-21 — End: 2015-10-21
  Administered 2015-10-21: 100 mg via INTRADERMAL

## 2015-10-21 MED ORDER — PROPOFOL 10 MG/ML IV BOLUS
INTRAVENOUS | Status: AC
  Filled 2015-10-21: qty 20

## 2015-10-21 MED ORDER — ONDANSETRON HCL 4 MG/2ML IJ SOLN
INTRAMUSCULAR | Status: DC | PRN
  Administered 2015-10-21: 4 mg via INTRAVENOUS

## 2015-10-21 MED ORDER — HYDROCODONE-ACETAMINOPHEN 5-325 MG PO TABS: 1.0000 | ORAL_TABLET | Freq: Four times a day (QID) | ORAL | Status: DC | PRN

## 2015-10-21 MED ORDER — LACTATED RINGERS IR SOLN
Status: DC | PRN
  Administered 2015-10-21: 1000 mL

## 2015-10-21 MED ORDER — ACETAMINOPHEN 500 MG PO TABS
1000.0000 mg | ORAL_TABLET | Freq: Four times a day (QID) | ORAL | Status: DC
  Administered 2015-10-22 (×3): 1000 mg via ORAL
  Filled 2015-10-21 (×3): qty 2

## 2015-10-21 MED ORDER — DEXTROSE-NACL 5-0.45 % IV SOLN
INTRAVENOUS | Status: DC
  Administered 2015-10-21 – 2015-10-22 (×2): via INTRAVENOUS

## 2015-10-21 MED ORDER — PROCHLORPERAZINE EDISYLATE 5 MG/ML IJ SOLN: 10.0000 mg | INTRAMUSCULAR | Status: DC | PRN

## 2015-10-21 MED ORDER — DIPHENHYDRAMINE HCL 50 MG/ML IJ SOLN: 12.5000 mg | Freq: Four times a day (QID) | INTRAMUSCULAR | Status: DC | PRN

## 2015-10-21 MED ORDER — ONDANSETRON HCL 4 MG/2ML IJ SOLN: 4.0000 mg | INTRAMUSCULAR | Status: DC | PRN

## 2015-10-21 SURGICAL SUPPLY — 74 items
APL ESCP 34 STRL LF DISP (HEMOSTASIS) ×1
APPLICATOR SURGIFLO ENDO (HEMOSTASIS) ×3 IMPLANT
BAG SPEC RTRVL LRG 6X4 10 (ENDOMECHANICALS) ×1
CHLORAPREP W/TINT 26ML (MISCELLANEOUS) ×3 IMPLANT
CLIP LIGATING HEM O LOK PURPLE (MISCELLANEOUS) ×5 IMPLANT
CLIP LIGATING HEMO LOK XL GOLD (MISCELLANEOUS) ×2 IMPLANT
CLIP LIGATING HEMO O LOK GREEN (MISCELLANEOUS) ×8 IMPLANT
CLIP SUT LAPRA TY ABSORB (SUTURE) ×1 IMPLANT
COVER TIP SHEARS 8 DVNC (MISCELLANEOUS) ×1 IMPLANT
COVER TIP SHEARS 8MM DA VINCI (MISCELLANEOUS) ×2
DECANTER SPIKE VIAL GLASS SM (MISCELLANEOUS) ×3 IMPLANT
DRAIN CHANNEL 15F RND FF 3/16 (WOUND CARE) ×3 IMPLANT
DRAPE COLUMN DVNC XI (DISPOSABLE) ×1 IMPLANT
DRAPE DA VINCI XI COLUMN (DISPOSABLE) ×2
DRAPE INCISE IOBAN 66X45 STRL (DRAPES) ×3 IMPLANT
DRAPE LAPAROSCOPIC ABDOMINAL (DRAPES) ×1 IMPLANT
DRAPE SHEET LG 3/4 BI-LAMINATE (DRAPES) ×3 IMPLANT
DRSG TEGADERM 4X4.75 (GAUZE/BANDAGES/DRESSINGS) ×2 IMPLANT
ELECT PENCIL ROCKER SW 15FT (MISCELLANEOUS) ×3 IMPLANT
ELECT REM PT RETURN 9FT ADLT (ELECTROSURGICAL) ×3
ELECTRODE REM PT RTRN 9FT ADLT (ELECTROSURGICAL) ×1 IMPLANT
EVACUATOR SILICONE 100CC (DRAIN) ×3 IMPLANT
GAUZE SPONGE 2X2 8PLY STRL LF (GAUZE/BANDAGES/DRESSINGS) IMPLANT
GAUZE SPONGE 4X4 12PLY STRL (GAUZE/BANDAGES/DRESSINGS) ×2 IMPLANT
GLOVE BIO SURGEON STRL SZ 6.5 (GLOVE) ×2 IMPLANT
GLOVE BIO SURGEONS STRL SZ 6.5 (GLOVE) ×1
GLOVE BIOGEL M STRL SZ7.5 (GLOVE) ×6 IMPLANT
GOWN STRL REUS W/TWL LRG LVL3 (GOWN DISPOSABLE) ×6 IMPLANT
HEMOSTAT SURGICEL 4X8 (HEMOSTASIS) ×5 IMPLANT
KIT BASIN OR (CUSTOM PROCEDURE TRAY) ×3 IMPLANT
LIQUID BAND (GAUZE/BANDAGES/DRESSINGS) ×3 IMPLANT
LOOP VESSEL MAXI BLUE (MISCELLANEOUS) ×3 IMPLANT
MARKER SKIN DUAL TIP RULER LAB (MISCELLANEOUS) ×3 IMPLANT
NDL INSUFFLATION 14GA 120MM (NEEDLE) ×1 IMPLANT
NEEDLE INSUFFLATION 14GA 120MM (NEEDLE) ×3 IMPLANT
NS IRRIG 1000ML POUR BTL (IV SOLUTION) ×1 IMPLANT
PORT ACCESS TROCAR AIRSEAL 12 (TROCAR) ×1 IMPLANT
PORT ACCESS TROCAR AIRSEAL 5M (TROCAR) ×2
POSITIONER SURGICAL ARM (MISCELLANEOUS) ×6 IMPLANT
POUCH SPECIMEN RETRIEVAL 10MM (ENDOMECHANICALS) ×3 IMPLANT
RELOAD STAPLE 60 2.6 WHT THN (STAPLE) IMPLANT
RELOAD STAPLER WHITE 60MM (STAPLE) IMPLANT
SET TRI-LUMEN FLTR TB AIRSEAL (TUBING) ×3 IMPLANT
SET TUBE IRRIG SUCTION NO TIP (IRRIGATION / IRRIGATOR) ×2 IMPLANT
SOLUTION ELECTROLUBE (MISCELLANEOUS) ×3 IMPLANT
SPONGE GAUZE 2X2 STER 10/PKG (GAUZE/BANDAGES/DRESSINGS) ×2
SPONGE LAP 4X18 X RAY DECT (DISPOSABLE) ×3 IMPLANT
STAPLE ECHEON FLEX 60 POW ENDO (STAPLE) IMPLANT
STAPLER RELOAD WHITE 60MM (STAPLE)
SURGIFLO W/THROMBIN 8M KIT (HEMOSTASIS) ×3 IMPLANT
SUT ETHILON 3 0 PS 1 (SUTURE) ×3 IMPLANT
SUT MNCRL AB 4-0 PS2 18 (SUTURE) ×8 IMPLANT
SUT PDS AB 1 CT1 27 (SUTURE) ×6 IMPLANT
SUT V-LOC BARB 180 2/0GR6 GS22 (SUTURE) ×3
SUT VIC AB 0 CT1 27 (SUTURE) ×12
SUT VIC AB 0 CT1 27XBRD ANTBC (SUTURE) ×4 IMPLANT
SUT VIC AB 2-0 CT1 27 (SUTURE) ×3
SUT VIC AB 2-0 CT1 TAPERPNT 27 (SUTURE) IMPLANT
SUT VIC AB 2-0 SH 27 (SUTURE) ×6
SUT VIC AB 2-0 SH 27X BRD (SUTURE) ×2 IMPLANT
SUT VIC AB 3-0 SH 27 (SUTURE) ×3
SUT VIC AB 3-0 SH 27X BRD (SUTURE) IMPLANT
SUT VLOC BARB 180 ABS3/0GR12 (SUTURE) ×3
SUTURE V-LC BRB 180 2/0GR6GS22 (SUTURE) IMPLANT
SUTURE VLOC BRB 180 ABS3/0GR12 (SUTURE) ×1 IMPLANT
TAPE STRIPS DRAPE STRL (GAUZE/BANDAGES/DRESSINGS) ×3 IMPLANT
TOWEL OR 17X26 10 PK STRL BLUE (TOWEL DISPOSABLE) ×4 IMPLANT
TOWEL OR NON WOVEN STRL DISP B (DISPOSABLE) ×3 IMPLANT
TRAY FOLEY W/METER SILVER 14FR (SET/KITS/TRAYS/PACK) ×1 IMPLANT
TRAY FOLEY W/METER SILVER 16FR (SET/KITS/TRAYS/PACK) ×3 IMPLANT
TRAY LAPAROSCOPIC (CUSTOM PROCEDURE TRAY) ×3 IMPLANT
TROCAR BLADELESS OPT 5 100 (ENDOMECHANICALS) ×2 IMPLANT
TROCAR XCEL 12X100 BLDLESS (ENDOMECHANICALS) ×3 IMPLANT
WATER STERILE IRR 1500ML POUR (IV SOLUTION) ×2 IMPLANT

## 2015-10-21 NOTE — Anesthesia Preprocedure Evaluation (Addendum)
Anesthesia Evaluation  Patient identified by MRN, date of birth, ID band Patient awake    Reviewed: Allergy & Precautions, NPO status , Patient's Chart, lab work & pertinent test results  Airway Mallampati: I  TM Distance: >3 FB Neck ROM: Full    Dental  (+) Teeth Intact   Pulmonary neg pulmonary ROS,    breath sounds clear to auscultation       Cardiovascular hypertension, Pt. on medications  Rhythm:Regular Rate:Normal     Neuro/Psych negative neurological ROS  negative psych ROS   GI/Hepatic Neg liver ROS, GERD  ,  Endo/Other  negative endocrine ROS  Renal/GU Renal disease  negative genitourinary   Musculoskeletal negative musculoskeletal ROS (+)   Abdominal   Peds negative pediatric ROS (+)  Hematology negative hematology ROS (+)   Anesthesia Other Findings   Reproductive/Obstetrics negative OB ROS                            Lab Results  Component Value Date   WBC 5.2 10/17/2015   HGB 14.6 10/17/2015   HCT 41.5 10/17/2015   MCV 87.7 10/17/2015   PLT 139* 10/17/2015   Lab Results  Component Value Date   CREATININE 0.94 10/17/2015   BUN 24* 10/17/2015   NA 139 10/17/2015   K 4.6 10/17/2015   CL 107 10/17/2015   CO2 26 10/17/2015   No results found for: INR, PROTIME  09/2015 EKG: normal sinus rhythm, LVH.   Anesthesia Physical Anesthesia Plan  ASA: III  Anesthesia Plan: General   Post-op Pain Management:    Induction: Intravenous  Airway Management Planned: Oral ETT  Additional Equipment:   Intra-op Plan:   Post-operative Plan: Extubation in OR  Informed Consent: I have reviewed the patients History and Physical, chart, labs and discussed the procedure including the risks, benefits and alternatives for the proposed anesthesia with the patient or authorized representative who has indicated his/her understanding and acceptance.   Dental advisory given  Plan  Discussed with: CRNA  Anesthesia Plan Comments: (Anesthetic plan discussed in detail. Associated risk discussed including but not limited to life threatening cardiovascular, pulmonary events and dental damage. The postoperative pain management and antiemetic plan discussed with patient. All questions answered in detail. Patient is in agreement.   )       Anesthesia Quick Evaluation

## 2015-10-21 NOTE — H&P (Signed)
Jesus Yang is an 75 y.o. male.    Chief Complaint: Pre-Op RIGHT robotic partial nephrectomy  HPI:   1 -  Solid enhancing right renal mass - 2cm right lower pole mass by CT and dedicated MRI 2017 initially on eval recurrent gross hematuria. 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 No adenopathy. This is in background of simple cysts.  PMH sig for obesity, bilateral knee replacement, upper abdominal melanoma s/p excision (scar on upper abdomen NOT from intra-abdominal surgery). NO CV disease / blood thinners. 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 His PCP is Howard Pouch DO with [facility].   Today " Timmothy Sours " is seen to proceed with RIGHT partial nephrectomy. NO interval fevers.      Past Medical History  Diagnosis Date  . Kidney stone   . Hypertension   . Hyperlipidemia   . GERD (gastroesophageal reflux disease)   . BPH (benign prostatic hyperplasia)     follows with urology  . Arthralgia     bilateral knees  . History of EKG     incomplete RBBB    Past Surgical History  Procedure Laterality Date  . Kidney cyst removal  1980  . Replacement total knee    . Melanoma excision  2006    of chest  . Joint replacement      bilateral knee  . Green light laser turp (transurethral resection of prostate  04/18/2012    Procedure: GREEN LIGHT LASER TURP (TRANSURETHRAL RESECTION OF PROSTATE;  Surgeon: Ailene Rud, MD;  Location: [facility];  Service: Urology;  Laterality: N/A;    Family History  Problem Relation Age of Onset  . Melanoma Father   . Colon cancer Neg Hx    Social History:  reports that he has never smoked. He has never used smokeless tobacco. He reports that he drinks alcohol. He reports that he does not use illicit drugs.  Allergies: No Known Allergies  No prescriptions prior to admission    No results found for this or  any previous visit (from the past 48 hour(s)). No results found.  Review of Systems  Constitutional: Negative.  Negative for fever and chills.  HENT: Negative.   Eyes: Negative.   Respiratory: Negative.   Cardiovascular: Negative.   Gastrointestinal: Negative.   Genitourinary: Negative for flank pain.  Musculoskeletal: Negative.   Skin: Negative.   Neurological: Negative.   Endo/Heme/Allergies: Negative.   Psychiatric/Behavioral: Negative.     There were no vitals taken for this visit. Physical Exam  Constitutional: He appears well-developed.  HENT:  Head: Normocephalic.  Eyes: Pupils are equal, round, and reactive to light.  Neck: Normal range of motion.  Cardiovascular: Normal rate.   Respiratory: Effort normal.  GI: Soft.  Abdominal wall scar c/w prior melanoma excision. No hernias.   Genitourinary:  No CVAT  Musculoskeletal: Normal range of motion.  Neurological: He is alert.  Skin: Skin is warm.  Psychiatric: He has a normal mood and affect. His behavior is normal. Judgment and thought content normal.     Assessment/Plan  1 - Solid enhancing right renal mass -  We rediscussed the role of partial nephrectomy with the overall goals being a balance of trying to achieve complete surgical excision (negative margins) while minimizing loss of normally functioning kidney. We then rediscussed surgical approaches including robotic and open techniques with robotic associated with  a shorter convalescence. I showed the patient on their abdomen the approximately 4-6 incision (trocar) sites as well as presumed extraction sites with robotic approach as well as possible open incision sites. We specifically readdressed that there may be need to alter operative plans according to intraopertive findings including conversion to open procedure or conversion to radical nephrectomy as well as need for adjunctive procedures such as ureteral stenting to promote correct renal healing. We rediscussed  specific peri-operative risks including bleeding, infection, deep vein thrombosis, pulmonary embolism, compartment syndrome, neuropathy / neuropraxia, heart attack, stroke, death, as well as long-term risks such as non-cure / need for additional therapy and need for imaging and lab based post-op surveillance protocols. We rediscussed typical hospital course of approximately 2 day hospitalization, need for peri-operative drains / catheters, and typical post-hospital course with return to most non-strenuous activities by 2 weeks and ability to return to most jobs and more strenuous activity such as exercise by 6 weeks.   After this lengthy and detail discussion, including answering all of the patient's questions to their satisfaction, they have chosen to proceed with right robotic parital nephrectomy today as planned.   Alexis Frock, MD 10/21/2015, 6:53 AM

## 2015-10-21 NOTE — Brief Op Note (Signed)
10/21/2015  5:09 PM  PATIENT:  Estella Husk Bertone  75 y.o. male  PRE-OPERATIVE DIAGNOSIS:  RIGHT RENAL MASS  POST-OPERATIVE DIAGNOSIS:  RIGHT RENAL MASS  PROCEDURE:  Procedure(s) with comments: XI ROBOTIC ASSISTED PARTIAL RIGHT NEPHRECTOMY (Right) - Clamp on: [id number]  SURGEON:  Surgeon(s) and Role:    * Alexis Frock, MD - Primary  PHYSICIAN ASSISTANT:   ASSISTANTS: Debbrah Alar PA   ANESTHESIA:   local and general  EBL:  Total I/O In: 3200 [I.V.:3200] Out: 640 [Urine:390; Drains:50; Blood:200]  BLOOD ADMINISTERED:none  DRAINS: none   LOCAL MEDICATIONS USED:  MARCAINE     SPECIMEN:  Source of Specimen:  1- right partial nephrectom, 2- Peri-Tumor Fat, 3- Deep tumro margin "1 and 2"  DISPOSITION OF SPECIMEN:  PATHOLOGY  COUNTS:  YES  TOURNIQUET:  * No tourniquets in log *  DICTATION: .Other Dictation: Dictation Number [id number]  PLAN OF CARE: Admit to inpatient   PATIENT DISPOSITION:  PACU - hemodynamically stable.   Delay start of Pharmacological VTE agent (>24hrs) due to surgical blood loss or risk of bleeding: yes

## 2015-10-21 NOTE — Anesthesia Procedure Notes (Signed)
Procedure Name: Intubation Date/Time: 10/21/2015 12:21 PM Performed by: West Pugh Pre-anesthesia Checklist: Patient identified, Emergency Drugs available, Suction available, Patient being monitored and Timeout performed Patient Re-evaluated:Patient Re-evaluated prior to inductionOxygen Delivery Method: Circle system utilized Preoxygenation: Pre-oxygenation with 100% oxygen Intubation Type: IV induction Ventilation: Mask ventilation without difficulty Laryngoscope Size: Mac and 4 Grade View: Grade I Tube type: Oral Tube size: 7.5 mm Number of attempts: 1 Airway Equipment and Method: Stylet Placement Confirmation: ETT inserted through vocal cords under direct vision,  positive ETCO2,  CO2 detector and breath sounds checked- equal and bilateral Secured at: 22 cm Tube secured with: Tape Dental Injury: Teeth and Oropharynx as per pre-operative assessment

## 2015-10-21 NOTE — Discharge Instructions (Signed)

## 2015-10-21 NOTE — Transfer of Care (Signed)
Immediate Anesthesia Transfer of Care Note  Patient: Jesus Yang  Procedure(s) Performed: Procedure(s) with comments: XI ROBOTIC ASSISTED PARTIAL RIGHT NEPHRECTOMY (Right) - Clamp on: 1436  Patient Location: PACU  Anesthesia Type:General  Level of Consciousness: sedated  Airway & Oxygen Therapy: Patient Spontanous Breathing and Patient connected to face mask oxygen  Post-op Assessment: Report given to RN and Post -op Vital signs reviewed and stable  Post vital signs: Reviewed and stable  Last Vitals:  Filed Vitals:   10/21/15 0918  BP: 156/69  Pulse: 65  Temp: 36.4 C  Resp: 18    Last Pain: There were no vitals filed for this visit.       Complications: No apparent anesthesia complications

## 2015-10-21 NOTE — Anesthesia Postprocedure Evaluation (Signed)
Anesthesia Post Note  Patient: Jesus Yang  Procedure(s) Performed: Procedure(s) (LRB): XI ROBOTIC ASSISTED PARTIAL RIGHT NEPHRECTOMY (Right)  Patient location during evaluation: PACU Anesthesia Type: General Level of consciousness: awake and alert Pain management: pain level controlled Vital Signs Assessment: post-procedure vital signs reviewed and stable Respiratory status: spontaneous breathing, nonlabored ventilation, respiratory function stable and patient connected to nasal cannula oxygen Cardiovascular status: blood pressure returned to baseline and stable Postop Assessment: no signs of nausea or vomiting Anesthetic complications: no    Last Vitals:  Filed Vitals:   10/21/15 1645 10/21/15 1700  BP: 144/63   Pulse: 77   Temp:  36.7 C  Resp: 14     Last Pain:  Filed Vitals:   10/21/15 1704  PainSc: 0-No pain                 Renie Stelmach A

## 2015-10-22 LAB — BASIC METABOLIC PANEL
ANION GAP: 4 — AB (ref 5–15)
BUN: 19 mg/dL (ref 6–20)
CHLORIDE: 105 mmol/L (ref 101–111)
CO2: 28 mmol/L (ref 22–32)
Calcium: 8.2 mg/dL — ABNORMAL LOW (ref 8.9–10.3)
Creatinine, Ser: 0.87 mg/dL (ref 0.61–1.24)
GFR calc Af Amer: >60 mL/min (ref >60)
GFR calc non Af Amer: >60 mL/min (ref >60)
GLUCOSE: 149 mg/dL — AB (ref 65–99)
POTASSIUM: 4.3 mmol/L (ref 3.5–5.1)
SODIUM: 137 mmol/L (ref 135–145)

## 2015-10-22 LAB — HEMOGLOBIN AND HEMATOCRIT, BLOOD
HCT: 37.7 % — ABNORMAL LOW (ref 39.0–52.0)
HEMOGLOBIN: 13 g/dL (ref 13.0–17.0)

## 2015-10-22 LAB — CREATININE, FLUID (PLEURAL, PERITONEAL, JP DRAINAGE): Creat, Fluid: 0.9 mg/dL

## 2015-10-22 NOTE — Op Note (Signed)
Jesus Yang, Jesus Yang NO.:  [id number]  MEDICAL RECORD NO.:  [id number]  LOCATION:  [id number]                         FACILITY:  [facility]  PHYSICIAN:  Alexis Frock, MD     DATE OF BIRTH:  05-15-1941  DATE OF PROCEDURE: 10/21/2015                               OPERATIVE REPORT  DIAGNOSIS:  Right enhancing solid renal mass.  PROCEDURE:  Robotic-assisted laparoscopic right partial nephrectomy.  ESTIMATED BLOOD LOSS:  150 mL.  COMPLICATION:  None.  SPECIMENS: 1. Peritumor fat for permanent pathology. 2. Right partial nephrectomy for permanent pathology. 3. Deep margin #1 for frozen section. 4. Deep margin #2  for frozen section.  FINDINGS: 1. Single early-branching artery 3-vein, right renovascular anatomy as     anticipated. 2. Significant desmoplastic reaction around the lower pole of the     kidney and tumor area. 3. Slight disruption of the outer and inner edge of small renal mass     during extraction within the extraction apparatus.  ASSISTANT:  Debbrah Alar, PA.  DRAINS: 1. Jackson-Pratt drain to bulb suction. 2. Foley catheter to straight drain.  INDICATIONS:  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  No obvious distant disease.  Options were discussed for management including surveillance protocols versus ablative therapy versus surgical extirpation with and without minimally invasive assistance with and without nephron sparing, and the patient adamantly wished to undergo right partial nephrectomy with minimally invasive assistance.  Informed consent was obtained and placed in the medical record.  PROCEDURE IN DETAIL:  The patient being Jesus Yang, was verified. Procedure being right robotic partial nephrectomy was confirmed. Procedure was carried out.  Time-out was  performed.  Intravenous antibiotics were administered.  General endotracheal anesthesia was introduced.  Foley catheter was placed per urethra to straight drain. The patient was then placed into a right side up full flank position, applying 15 degrees of stable flexion, superior arm elevator, axillary roll, sequential compression devices, bottom leg bent, top leg straight. He was further fashioned to the operating table using 3-inch tape over foam padding across his supraxiphoid chest and his pelvis.  Sterile field was created by clipper shaving and then prepping and draping the patient's entire right flank and abdomen using chlorhexidine gluconate. Next, a high-flow, low-pressure pneumoperitoneum was obtained using Veress technique in the right lower quadrant having passed the aspiration and drop test.  Next, an 8-mm robotic camera port was placed and positioned approximately 1 handbreadth superior-lateral to the umbilicus.  Laparoscopic examination of the peritoneal cavity revealed no significant adhesions and no visceral injury.  Additional ports were placed as follows:  Right subcostal 8-mm robotic port, right far lateral 8-mm robotic port 4 fingerbreadths superior-medial to the anterior superior iliac spine and the inferior paramedian robotic port approximately 1 handbreadth superior to the pubic ramus.  Two assistant port sites in the midline, one just above the umbilicus and another 2 fingerbreadths above the camera port, and a 5-mm subxiphoid port, which was used for self-locking grasper as liver retractor was placed in  the liver on gentle superior traction away from the anterior surface of Gerota's fascia.  Attention was then directed at development of retroperitoneum.  Incision was made lateral to the ascending colon from the area of the cecum towards the area of the hepatic flexure and was carefully swept medially.  Lower aspect of the anterior surface of Gerota's fascia was  identified, placed on gentle lateral traction and additional mesenteric fat was swept away from the anterior surface of this.  The duodenum was encountered and very carefully, purposely kocherized medially.  Such that, it lie medial to the inferior vena cava.  Dissection was then proceeded just medial to the lower pole of the kidney.  The ureter and gonadal vessels were encountered, placed on gentle lateral traction.  The psoas muscle was identified as the medial border of the inferior vena cava and dissection proceeded within this triangle superiorly towards the area of the renal hilum.  The renal hilum was quite complex as anticipated, consisted of a very early branching renal vein and such that, there were essentially three separate renal veins with an incredibly wide base interface with the inferior vena cava.  There was a single early branching renal artery that was then behind the midaspect of the triple vein complex, it was very carefully and circumferentially mobilized and marked with the vessel loop and able to better visualize the artery, the gonadal vein was purposely ligated after extra-large clipping up and down to provide better window to the area behind the renal vein.  Attention was then directed at identification of the tumor.  Dissection was proceeded directly onto the anterior surface of the kidney.  There was a significant desmoplastic sticky fat.  The lower half of the kidney was very carefully defatted, the mass in question was clearly noted, it was quite solid and cancerous-appearing and the fat was carefully swept away around its interface with the kidney.  Fat overlying the tumor was carefully dissected away and set aside as a separate permanent specimen- labeled peritumor fat.  Intraoperative ultrasound was performed, which helped corroborated the deep aspect of the tumor and the renal capsule was scored in this area to demarcate this.  Warm ischemia was then  began by placing two bulldog clamps on the renal artery stump and very careful partial nephrectomy was performed keeping what appeared to be a small rim of normal tissue with the partial nephrectomy specimen.  Renorrhaphy was performed first layer of running 3-0 V-Loc, oversewing several small branch vessels.  There were no obvious entrances into the collecting system.  This resulted in excellent initial hemostasis.  The bulldog clamps were then released for total warm ischemia time of 16 minutes. Additional layer renorrhaphy was performed by placing a bolster onto the partial nephrectomy bed and placing three parenchymal apposition sutures of 0 Vicryl, sandwiched between Hem-o-Loks and lapper ties of medium size.  Additional second layer renorrhaphy, two separate deep margins were obtained, one of the lateral aspect of resection, another of the deep aspect of resection, which appeared to have some sinus fat in this, both of these were negative for carcinoma consistent with the fatty tissue by frozen section.  Gerota's fascia was then reapproximated over the area after placing an additional piece of Surgicel over and this reapproximated using 2-0 running V-Loc suture.  Arterial vessel loop was removed.  All sponge and needle counts were correct.  The partial nephrectomy specimen was carefully placed into an EndoCatch bag for later retrieval.  Liver retractor was taken  down.  No hepatic injury was noted.  Robot was then undocked.  The superior-most 12-mm assistant port site was closed at the level of fascia using Carter-Thomason suture passer under laparoscopic vision.  Specimen was retrieved by extending the previous inferior 12-mm assistant port site for total distance of approximately 2 cm removing the partial nephrectomy specimen and setting it aside for permanent pathology.  This extraction site was closed at the level of the fascia using figure-of-eight PDS x3 followed  by reapproximation of Scarpa's with running Vicryl.  All incision sites were infiltrated with dilute lyophilized Marcaine and closed at the level of the skin using subcuticular Monocryl followed by Dermabond. Notably, a closed suction drain was brought through the previous lateral most robotic port site to the area of the peritoneal cavity using laparoscopic guidance.  Drain stitch was applied to this.  Inspection of the partial nephrectomy specimen on the back table did reveal that during removal of likely drainage suction through the fascia and slight disruption of the inferior most aspect, this was noted on Pathology request.  Procedure was then terminated.  The patient tolerated the procedure well.  There were no immediate periprocedural complications. The patient was taken to the postanesthesia care unit in stable condition.          ______________________________ Alexis Frock, MD     TM/MEDQ  D:  10/21/2015  T:  10/22/2015  Job:  [id number]

## 2015-10-22 NOTE — Discharge Summary (Signed)
Physician Discharge Summary  Patient ID: Jesus Yang MRN: [id number] DOB/AGE: 73-15-1942 75 y.o.  Admit date: 10/21/2015 Discharge date: 10/22/2015  Admission Diagnoses: Right renal neoplasm  Discharge Diagnoses:  Active Problems:   Renal mass   Discharged Condition: good  Hospital Course: He was admitted for an elective right partial nephrectomy after having a 2 cm lower pole right renal neoplasm noted on CT scan was obtained for hematuria. He underwent robotically assisted laparoscopic right partial nephrectomy without complication and was noted to be doing well the night of his surgery. The following morning he continued to do well with no complaint. He was tolerating regular diet with no nausea or vomiting. His urine was clear and his drain had a fairly low output. He is felt ready for discharge and his drain fluid will be sent for creatinine and it will be removed if it is found to be consistent with serum.  Discharge Exam: Blood pressure 123/54, pulse 60, temperature 98.2 F (36.8 C), temperature source Oral, resp. rate 16, height 6' (1.829 m), weight 125.193 kg (276 lb), SpO2 99 %. General appearance: alert, cooperative and no distress  His abdomen is soft and nontender. Port sites look good with no sign of drainage or erythema. No flank ecchymoses. Catheter draining and clear.  Disposition: 01-Home or Self Care     Medication List    STOP taking these medications        ALEVE 220 MG tablet  Generic drug:  naproxen sodium     aspirin EC 81 MG tablet      TAKE these medications        atorvastatin 10 MG tablet  Commonly known as:  LIPITOR  Take 10 mg by mouth daily.     benazepril 40 MG tablet  Commonly known as:  LOTENSIN  Take 1 tablet (40 mg total) by mouth every morning.     HYDROcodone-acetaminophen 5-325 MG tablet  Commonly known as:  NORCO  Take 1-2 tablets by mouth every 6 (six) hours as needed for moderate pain.     REFRESH TEARS 0.5 % Soln   Generic drug:  carboxymethylcellulose  Place 1 drop into both eyes daily as needed (For dry eyes.).           Follow-up Information    Follow up with Alexis Frock, MD On 11/08/2015.   Specialty:  Urology   Why:  at 8:45 for MD visit. Dr. Tresa Moore will call you with pathology results when available.    Contact information:   [place] [address] [phone]       Signed: Claybon Jabs 10/22/2015, 7:01 AM

## 2015-10-25 ENCOUNTER — Encounter (HOSPITAL_COMMUNITY): Payer: Self-pay | Admitting: Urology

## 2015-11-08 DIAGNOSIS — C641 Malignant neoplasm of right kidney, except renal pelvis: Secondary | ICD-10-CM | POA: Diagnosis not present

## 2015-11-10 ENCOUNTER — Encounter: Payer: Self-pay | Admitting: Family Medicine

## 2016-01-19 DIAGNOSIS — Z85820 Personal history of malignant melanoma of skin: Secondary | ICD-10-CM | POA: Diagnosis not present

## 2016-01-19 DIAGNOSIS — Z85828 Personal history of other malignant neoplasm of skin: Secondary | ICD-10-CM | POA: Diagnosis not present

## 2016-01-19 DIAGNOSIS — L821 Other seborrheic keratosis: Secondary | ICD-10-CM | POA: Diagnosis not present

## 2016-01-19 DIAGNOSIS — D235 Other benign neoplasm of skin of trunk: Secondary | ICD-10-CM | POA: Diagnosis not present

## 2016-01-19 DIAGNOSIS — L814 Other melanin hyperpigmentation: Secondary | ICD-10-CM | POA: Diagnosis not present

## 2016-01-19 DIAGNOSIS — D1801 Hemangioma of skin and subcutaneous tissue: Secondary | ICD-10-CM | POA: Diagnosis not present

## 2016-01-19 DIAGNOSIS — L570 Actinic keratosis: Secondary | ICD-10-CM | POA: Diagnosis not present

## 2016-01-23 ENCOUNTER — Telehealth: Payer: Self-pay | Admitting: Family Medicine

## 2016-01-23 ENCOUNTER — Encounter: Payer: Self-pay | Admitting: Family Medicine

## 2016-01-23 ENCOUNTER — Ambulatory Visit (INDEPENDENT_AMBULATORY_CARE_PROVIDER_SITE_OTHER): Payer: Medicare Other | Admitting: Family Medicine

## 2016-01-23 VITALS — BP 120/74 | HR 56 | Temp 97.6°F | Resp 20 | Ht 72.0 in | Wt 270.0 lb

## 2016-01-23 DIAGNOSIS — E785 Hyperlipidemia, unspecified: Secondary | ICD-10-CM

## 2016-01-23 DIAGNOSIS — Z6836 Body mass index (BMI) 36.0-36.9, adult: Secondary | ICD-10-CM | POA: Diagnosis not present

## 2016-01-23 DIAGNOSIS — Z6835 Body mass index (BMI) 35.0-35.9, adult: Secondary | ICD-10-CM | POA: Insufficient documentation

## 2016-01-23 DIAGNOSIS — I10 Essential (primary) hypertension: Secondary | ICD-10-CM | POA: Diagnosis not present

## 2016-01-23 DIAGNOSIS — Z23 Encounter for immunization: Secondary | ICD-10-CM | POA: Diagnosis not present

## 2016-01-23 DIAGNOSIS — C641 Malignant neoplasm of right kidney, except renal pelvis: Secondary | ICD-10-CM

## 2016-01-23 LAB — LIPID PANEL
Cholesterol: 153 mg/dL (ref 0–200)
HDL: 41.9 mg/dL (ref >39.00)
LDL Cholesterol: 96 mg/dL (ref 0–99)
NonHDL: 111.22
Total CHOL/HDL Ratio: 4
Triglycerides: 77 mg/dL (ref 0.0–149.0)
VLDL: 15.4 mg/dL (ref 0.0–40.0)

## 2016-01-23 MED ORDER — ATORVASTATIN CALCIUM 10 MG PO TABS
10.0000 mg | ORAL_TABLET | Freq: Every day | ORAL | 2 refills | Status: DC
Start: 2016-01-23 — End: 2016-08-14

## 2016-01-23 NOTE — Telephone Encounter (Signed)
Left message with lab results on patient home voice mail per DPR.

## 2016-01-23 NOTE — Progress Notes (Signed)
Patient ID: Jesus Yang, male   DOB: 1941-04-20, 75 y.o.   MRN: [id number]      Patient ID: Jesus Yang, male  DOB: March 01, 1941, 75 y.o.   MRN: [id number]  Subjective:  Jesus Yang is a 75 y.o. male   Hypertension: CHRONIC HYPERTENSION/hyperlipidemia: patient reports Blood pressure range: below 140/90 at home he denies chest pain, shortness of breath, LE edema or dizziness. He does watch the salt content in his diet and exercises routinely (swimming, elliptical, tread mill). Benazepril 40 mg daily compliance. Pt prescribed Lipitor and takes baby ASA daily. No family history of heart disease.   Past Medical History:  Diagnosis Date  . Arthralgia    bilateral knees  . BPH (benign prostatic hyperplasia)    follows with urology  . GERD (gastroesophageal reflux disease)   . History of EKG    incomplete RBBB  . Hyperlipidemia   . Hypertension   . Kidney stone   . Renal cell carcinoma of right kidney (HCC)    No Known Allergies Past Surgical History:  Procedure Laterality Date  . GREEN LIGHT LASER TURP (TRANSURETHRAL RESECTION OF PROSTATE  04/18/2012   Procedure: GREEN LIGHT LASER TURP (TRANSURETHRAL RESECTION OF PROSTATE;  Surgeon: Ailene Rud, MD;  Location: [facility];  Service: Urology;  Laterality: N/A;  . JOINT REPLACEMENT     bilateral knee  . KIDNEY CYST REMOVAL  1980  . MELANOMA EXCISION  2006   of chest  . REPLACEMENT TOTAL KNEE    . ROBOTIC ASSITED PARTIAL NEPHRECTOMY Right 10/21/2015   Procedure: XI ROBOTIC ASSISTED PARTIAL RIGHT NEPHRECTOMY;  Surgeon: Alexis Frock, MD;  Location: [facility];  Service: Urology;  Laterality: Right;   Family History  Problem Relation Age of Onset  . Melanoma Father   . Colon cancer Neg Hx    Social History   Social History  . Marital status: Married    Spouse name: N/A  . Number of children: N/A  . Years of education: N/A   Occupational History  . Not on file.   Social History Main Topics  . Smoking status: Never  Smoker  . Smokeless tobacco: Never Used  . Alcohol use Yes     Comment: occassionally  . Drug use: No  . Sexual activity: Yes   Other Topics Concern  . Not on file   Social History Narrative   Retired from TEFL teacher.    Married. 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    Caffeinated beverages.   Wears his seatbelt, wears a bicycle helmet, exercises at least 3 times a week.   Takes a daily vitamin.   There is a smoke alarm in the home.   There are firearms in the home, locked.           ROS: Negative, with the exception of above mentioned in HPI  Objective: BP 120/74 (BP Location: Right Arm, Patient Position: Sitting, Cuff Size: Large)   Pulse (!) 56   Temp 97.6 F (36.4 C) (Oral)   Resp 20   Ht 6' (1.829 m)   Wt 270 lb (122.5 kg)   SpO2 96%   BMI 36.62 kg/m  Gen: Afebrile. No acute distress. Nontoxic in appearance, well-developed, well-nourished, Caucasian male. HENT: AT. Jesus Yang.  MMM Eyes:Pupils Equal Round Reactive to light, Extraocular movements intact,  Conjunctiva without redness, discharge or icterus. Neck/lymp/endocrine: Supple, no lymphadenopathy CV: RRR, no edema, +2/4 P posterior tibialis pulses.  Chest: CTAB, no wheeze, rhonchi or crackles. Abd: Soft.  NTND. BS present  Skin: No rashes, purpura or petechiae. Warm and well-perfused. Skin intact. Neuro/Msk: Normal gait. PERLA. EOMi. Alert. Oriented x3.   Psych: Normal affect, dress and demeanor. Normal speech. Normal thought content and judgment.  Assessment/plan: Jesus Yang is a 75 y.o. male  present for establishment of care Essential hypertension, benign - Stable. - Low salt diet, continue.  - exercise > 150 minutes of week.  - CMP every 6 months.  - Continue benazepril.  - Continue daily baby aspirin - Follow-up in 6 months.  Hyperlipidemia/BMI 36 - Continue atorvastatin, patient desiring to come off medication if possible on next lipid check. He does not need refills on this medication today. -  lipid panel - continue exercise and diet regimen.  - refills on Lipitor  provided today.  - health maintenance: flu shot today Influenza vaccine needed - Flu vaccine HIGH DOSE PF (Fluzone High dose)  Renal cancer, right ([place]) - Successful partial nephrectomy completed in May.  - continue following with urology.    - every 6 months chronic medical conditions - yearly medicare wellness.   > 25 minutes spent with patient, >50% of time spent face to face counseling patient and coordinating care.   Howard Pouch, DO [place]

## 2016-01-23 NOTE — Telephone Encounter (Signed)
Please call pt: - his lipids look great.   Lipid Panel     Component Value Date/Time   CHOL 153 01/23/2016 0930   TRIG 77.0 01/23/2016 0930   HDL 41.90 01/23/2016 0930   CHOLHDL 4 01/23/2016 0930   VLDL 15.4 01/23/2016 0930   LDLCALC 96 01/23/2016 0930

## 2016-01-23 NOTE — Patient Instructions (Signed)
We will collect labs today and call you with results once available.  Flu shot administered today. Schedule Medicare wellness, bring a copy of advanced directives if you have them.  Refills called in to your pharmacy.

## 2016-04-13 ENCOUNTER — Ambulatory Visit (HOSPITAL_COMMUNITY)
Admission: RE | Admit: 2016-04-13 | Discharge: 2016-04-13 | Disposition: A | Discharge status: Home / Self Care | Payer: Medicare Other | Source: Ambulatory Visit | Attending: Urology | Admitting: Urology

## 2016-04-13 ENCOUNTER — Other Ambulatory Visit: Payer: Self-pay | Admitting: Urology

## 2016-04-13 DIAGNOSIS — C641 Malignant neoplasm of right kidney, except renal pelvis: Secondary | ICD-10-CM | POA: Diagnosis not present

## 2016-04-16 DIAGNOSIS — N200 Calculus of kidney: Secondary | ICD-10-CM | POA: Diagnosis not present

## 2016-04-16 DIAGNOSIS — C641 Malignant neoplasm of right kidney, except renal pelvis: Secondary | ICD-10-CM | POA: Diagnosis not present

## 2016-04-23 DIAGNOSIS — N281 Cyst of kidney, acquired: Secondary | ICD-10-CM | POA: Diagnosis not present

## 2016-04-23 DIAGNOSIS — R351 Nocturia: Secondary | ICD-10-CM | POA: Diagnosis not present

## 2016-04-23 DIAGNOSIS — N401 Enlarged prostate with lower urinary tract symptoms: Secondary | ICD-10-CM | POA: Diagnosis not present

## 2016-04-23 DIAGNOSIS — R972 Elevated prostate specific antigen [PSA]: Secondary | ICD-10-CM | POA: Diagnosis not present

## 2016-04-23 DIAGNOSIS — C641 Malignant neoplasm of right kidney, except renal pelvis: Secondary | ICD-10-CM | POA: Diagnosis not present

## 2016-07-02 ENCOUNTER — Telehealth: Payer: Self-pay | Admitting: Family Medicine

## 2016-07-02 NOTE — Telephone Encounter (Signed)
Pt can try coricidin and/ or plain mucinex or Mucinex DM. Tylenol for headache.  If symptoms worsen he should be seen.

## 2016-07-02 NOTE — Telephone Encounter (Signed)
Wife states that pt has headache, sinus pressure, deep cough, and just feels bad. Wife asking would could pt take OTC that would help him get better, Please advise

## 2016-07-03 NOTE — Telephone Encounter (Signed)
Dr. Raoul Pitch reviewed instructions with wife at her office visit.

## 2016-07-05 ENCOUNTER — Encounter: Payer: Self-pay | Admitting: Family Medicine

## 2016-07-05 ENCOUNTER — Ambulatory Visit (INDEPENDENT_AMBULATORY_CARE_PROVIDER_SITE_OTHER): Payer: Medicare Other | Admitting: Family Medicine

## 2016-07-05 VITALS — BP 139/78 | HR 62 | Temp 98.9°F | Resp 20 | Wt 272.5 lb

## 2016-07-05 DIAGNOSIS — R05 Cough: Secondary | ICD-10-CM

## 2016-07-05 DIAGNOSIS — J320 Chronic maxillary sinusitis: Secondary | ICD-10-CM

## 2016-07-05 DIAGNOSIS — R059 Cough, unspecified: Secondary | ICD-10-CM

## 2016-07-05 MED ORDER — BENZONATATE 200 MG PO CAPS: 200.0000 mg | ORAL_CAPSULE | Freq: Two times a day (BID) | ORAL | 0 refills | Status: DC | PRN

## 2016-07-05 MED ORDER — DOXYCYCLINE HYCLATE 100 MG PO TABS: 100.0000 mg | ORAL_TABLET | Freq: Two times a day (BID) | ORAL | 0 refills | Status: DC

## 2016-07-05 MED ORDER — DOXYCYCLINE HYCLATE 100 MG PO TABS
100.0000 mg | ORAL_TABLET | Freq: Two times a day (BID) | ORAL | 0 refills | Status: DC
Start: 2016-07-05 — End: 2016-07-05

## 2016-07-05 NOTE — Progress Notes (Signed)
Jesus Yang , 08-07-1940, 76 y.o., male MRN: [id number] Patient Care Team    Relationship Specialty Notifications Start End  Ma Hillock, DO PCP - General Family Medicine  07/13/15   Carolan Clines, MD Consulting Physician Urology  11/10/15     CC: cough Subjective: Pt presents for an acute OV with complaints of cough of 2 weeks duration.  Associated symptoms include fever, muscle aches, headache, congestion, sinus pressure. He states it started with flu like symptoms after his trip and progressed in cough and sinus symptoms. He denies shortness of breath and has tried theraflu.    No Known Allergies Social History  Substance Use Topics  . Smoking status: Never Smoker  . Smokeless tobacco: Never Used  . Alcohol use Yes     Comment: occassionally   Past Medical History:  Diagnosis Date  . Arthralgia    bilateral knees  . BPH (benign prostatic hyperplasia)    follows with urology  . GERD (gastroesophageal reflux disease)   . History of EKG    incomplete RBBB  . Hyperlipidemia   . Hypertension   . Kidney stone   . Renal cell carcinoma of right kidney [facility])    Past Surgical History:  Procedure Laterality Date  . GREEN LIGHT LASER TURP (TRANSURETHRAL RESECTION OF PROSTATE  04/18/2012   Procedure: GREEN LIGHT LASER TURP (TRANSURETHRAL RESECTION OF PROSTATE;  Surgeon: Ailene Rud, MD;  Location: [facility];  Service: Urology;  Laterality: N/A;  . JOINT REPLACEMENT     bilateral knee  . KIDNEY CYST REMOVAL  1980  . MELANOMA EXCISION  2006   of chest  . REPLACEMENT TOTAL KNEE    . ROBOTIC ASSITED PARTIAL NEPHRECTOMY Right 10/21/2015   Procedure: XI ROBOTIC ASSISTED PARTIAL RIGHT NEPHRECTOMY;  Surgeon: Alexis Frock, MD;  Location: [facility];  Service: Urology;  Laterality: Right;   Family History  Problem Relation Age of Onset  . Melanoma Father   . Colon cancer Neg Hx    Allergies as of 07/05/2016   No Known Allergies     Medication List       Accurate  as of 07/05/16 11:13 AM. Always use your most recent med list.          aspirin EC 81 MG tablet Take 81 mg by mouth daily.   atorvastatin 10 MG tablet Commonly known as:  LIPITOR Take 1 tablet (10 mg total) by mouth daily.   benazepril 40 MG tablet Commonly known as:  LOTENSIN Take 1 tablet (40 mg total) by mouth every morning.   dextromethorphan-guaiFENesin 30-600 MG 12hr tablet Commonly known as:  MUCINEX DM Take 1 tablet by mouth 2 (two) times daily.   REFRESH TEARS 0.5 % Soln Generic drug:  carboxymethylcellulose Place 1 drop into both eyes daily as needed (For dry eyes.).       No results found for this or any previous visit (from the past 24 hour(s)). No results found.   ROS: Negative, with the exception of above mentioned in HPI   Objective:  BP 139/78 (BP Location: Left Arm, Patient Position: Sitting, Cuff Size: Large)   Pulse 62   Temp 98.9 F (37.2 C)   Resp 20   Wt 272 lb 8 oz (123.6 kg)   SpO2 97%   BMI 36.96 kg/m  Body mass index is 36.96 kg/m. Gen: Afebrile. No acute distress. Nontoxic in appearance, well developed, well nourished.  HENT: AT. [place]. Bilateral TM visualized wnl. MMM, no oral  lesions. Bilateral nares with erythema, swelling and drainage. Throat without erythema or exudates. Cough present.  Eyes:Pupils Equal Round Reactive to light, Extraocular movements intact,  Conjunctiva without redness, discharge or icterus. Neck/lymp/endocrine: Supple,no lymphadenopathy CV: RRR  Chest: CTAB, no wheeze or crackles. Good air movement, normal resp effort.  Abd: Soft. NTND. BS present.  Skin: no rashes, purpura or petechiae.  Neuro:  Normal gait. PERLA. EOMi. Alert. Oriented x3   Assessment/Plan: [name] is a 76 y.o. male present for acute OV for  Maxillary sinusitis, unspecified chronicity/cough - rest, hydrate, mucinex DM - doxycycline (VIBRA-TABS) 100 MG tablet; Take 1 tablet (100 mg total) by mouth 2 (two) times daily.  Dispense: 20  tablet; Refill: 0 - benzonatate (TESSALON) 200 MG capsule; Take 1 capsule (200 mg total) by mouth 2 (two) times daily as needed for cough.  Dispense: 20 capsule; Refill: 0   electronically signed by:  Howard Pouch, DO  [place]

## 2016-07-05 NOTE — Patient Instructions (Addendum)
I have called in doxycycline for you infection. You have a sinus infection and possible bronchitis.    Mucinex DM continue. Rest and hydrate.

## 2016-08-13 ENCOUNTER — Telehealth: Payer: Self-pay | Admitting: Family Medicine

## 2016-08-13 NOTE — Progress Notes (Signed)
Subjective:   Jesus Yang is a 76 y.o. male who presents for an Initial Medicare Annual Wellness Visit.  Review of Systems  No ROS.  Medicare Wellness Visit.    Sleep patterns: Sleeps 5 hours, up to void x 1 occasionally.  Home Safety/Smoke Alarms:  Smoke detectors and security in place.   Living environment; residence and Firearm Safety: Lives with wife in 3 story home, rails in place. Firearms locked away. Feels safe in home.  Seat Belt Safety/Bike Helmet: Wears seat belt.   Counseling:   Eye Exam-Last exam 02/2016.   Dental-Last exam 2014. Only with issues.   Male:   CCS-Colonoscopy 10/21/2012, patient reports normal. Recall 10 years.  PSA-02/2012, 4.09. Followed by [facility].     Objective:    Today's Vitals   08/14/16 0809  BP: (!) 142/78  Pulse: 63  SpO2: 96%  Weight: 274 lb 12.8 oz (124.6 kg)  Height: 6' (1.829 m)   Body mass index is 37.27 kg/m.  Current Medications (verified) Outpatient Encounter Prescriptions as of 08/14/2016  Medication Sig  . aspirin EC 81 MG tablet Take 81 mg by mouth daily.  Marland Kitchen atorvastatin (LIPITOR) 10 MG tablet Take 1 tablet (10 mg total) by mouth daily.  . benazepril (LOTENSIN) 40 MG tablet Take 1 tablet (40 mg total) by mouth every morning.  . carboxymethylcellulose (REFRESH TEARS) 0.5 % SOLN Place 1 drop into both eyes daily as needed (For dry eyes.).  Marland Kitchen Psyllium (METAMUCIL PO) Take by mouth.  . benzonatate (TESSALON) 200 MG capsule Take 1 capsule (200 mg total) by mouth 2 (two) times daily as needed for cough. (Patient not taking: Reported on 08/14/2016)  . dextromethorphan-guaiFENesin (MUCINEX DM) 30-600 MG 12hr tablet Take 1 tablet by mouth 2 (two) times daily.  Marland Kitchen doxycycline (VIBRA-TABS) 100 MG tablet Take 1 tablet (100 mg total) by mouth 2 (two) times daily. (Patient not taking: Reported on 08/14/2016)   No facility-administered encounter medications on file as of 08/14/2016.     Allergies (verified) Patient has no  known allergies.   History: Past Medical History:  Diagnosis Date  . Arthralgia    bilateral knees  . BPH (benign prostatic hyperplasia)    follows with urology  . GERD (gastroesophageal reflux disease)   . History of EKG    incomplete RBBB  . Hyperlipidemia   . Hypertension   . Kidney stone   . Renal cell carcinoma of right kidney [facility])    Past Surgical History:  Procedure Laterality Date  . GREEN LIGHT LASER TURP (TRANSURETHRAL RESECTION OF PROSTATE  04/18/2012   Procedure: GREEN LIGHT LASER TURP (TRANSURETHRAL RESECTION OF PROSTATE;  Surgeon: Ailene Rud, MD;  Location: [facility];  Service: Urology;  Laterality: N/A;  . JOINT REPLACEMENT     bilateral knee  . KIDNEY CYST REMOVAL  1980  . MELANOMA EXCISION  2006   of chest  . REPLACEMENT TOTAL KNEE    . ROBOTIC ASSITED PARTIAL NEPHRECTOMY Right 10/21/2015   Procedure: XI ROBOTIC ASSISTED PARTIAL RIGHT NEPHRECTOMY;  Surgeon: Alexis Frock, MD;  Location: [facility];  Service: Urology;  Laterality: Right;   Family History  Problem Relation Age of Onset  . Melanoma Father   . Colon cancer Neg Hx    Social History   Occupational History  . Not on file.   Social History Main Topics  . Smoking status: Never Smoker  . Smokeless tobacco: Never Used  . Alcohol use Yes     Comment:  occassionally  . Drug use: No  . Sexual activity: Yes   Tobacco Counseling Counseling given: No   Activities of Daily Living In your present state of health, do you have any difficulty performing the following activities: 08/14/2016 10/21/2015  Hearing? N Y  Vision? N N  Difficulty concentrating or making decisions? N N  Walking or climbing stairs? N N  Dressing or bathing? N N  Doing errands, shopping? N N  Preparing Food and eating ? N -  Using the Toilet? N -  In the past six months, have you accidently leaked urine? N -  Do you have problems with loss of bowel control? N -  Managing your Medications? N -  Managing your Finances?  N -  Housekeeping or managing your Housekeeping? N -  Some recent data might be hidden    Immunizations and Health Maintenance Immunization History  Administered Date(s) Administered  . Influenza, High Dose Seasonal PF 01/23/2016  . Influenza-Unspecified 04/01/2013, 01/26/2014, 02/26/2015  . Pneumococcal Polysaccharide-23 12/14/2007  . Tdap 07/13/2015   Health Maintenance Due  Topic Date Due  . PNA vac Low Risk Adult (2 of 2 - PCV13) 12/13/2008    Patient Care Team: Ma Hillock, DO as PCP - General (Family Medicine) Carolan Clines, MD as Consulting Physician (Urology)  Indicate any recent Medical Services you may have received from other than [facility] providers in the past year (date may be approximate).    Assessment:   This is a routine wellness examination for Jesus Yang. Physical assessment deferred to PCP.   Hearing/Vision screen Hearing Screening Comments: Small amount of hearing loss. 5 years ago.  Vision Screening Comments: Wears reading glasses.   Dietary issues and exercise activities discussed: Current Exercise Habits: Home exercise routine (Works outdoor, walks 3-4 miles daily), Type of exercise: walking;Other - see comments (swims daily during the summer)   Diet (meal preparation, eat out, water intake, caffeinated beverages, dairy products, fruits and vegetables): Drinks water, low calorie powdered drink  Breakfast: Coffee Lunch: sandwich, cereal Dinner: lean protein, vegetables, starch  Discussed heart healthy diet and importance of not skipping breakfast.   Goals    . Weight (lb) < 250 lb (113.4 kg)          Lose weight by increasing activity and continuing to make healthy food choices and not eating past 6 pm.       Depression Screen PHQ 2/9 Scores 08/14/2016 07/13/2015  PHQ - 2 Score 0 0    Fall Risk Fall Risk  08/14/2016 07/13/2015  Falls in the past year? Yes No  Number falls in past yr: 1 -  Injury with Fall? No -  Risk for fall due to : -  History of fall(s)    Cognitive Function:       Ad8 score reviewed for issues:  Issues making decisions: no  Less interest in hobbies / activities: no  Repeats questions, stories (family complaining): no  Trouble using ordinary gadgets (microwave, computer, phone): no  Forgets the month or year: no  Mismanaging finances: no  Remembering appts: no  Daily problems with thinking and/or memory: no Ad8 score is=0     Screening Tests Health Maintenance  Topic Date Due  . PNA vac Low Risk Adult (2 of 2 - PCV13) 12/13/2008  . COLONOSCOPY  05/28/2022  . TETANUS/TDAP  07/12/2025  . INFLUENZA VACCINE  Completed   PCV13 administered today.      Plan:     Continue doing brain stimulating  activities (puzzles, reading, adult coloring books, staying active) to keep memory sharp.   Bring a copy of your advance directives to your next office visit.  Continue to eat heart healthy diet (full of fruits, vegetables, whole grains, lean protein, water--limit salt, fat, and sugar intake) and increase physical activity as tolerated.   During the course of the visit Jesus Yang was educated and counseled about the following appropriate screening and preventive services:   Vaccines to include Pneumoccal, Influenza, Hepatitis B, Td, Zostavax, HCV  Colorectal cancer screening  Cardiovascular disease screening  Diabetes screening  Glaucoma screening  Nutrition counseling  Prostate cancer screening  Patient Instructions (the written plan) were given to the patient.   Gerilyn Nestle, RN   08/14/2016    ________________________________________________________________________ Juluis Rainier: PCV13 administered today.         Patient requesting refills, message sent.

## 2016-08-13 NOTE — Telephone Encounter (Signed)
error 

## 2016-08-13 NOTE — Progress Notes (Signed)
Pre visit review using our clinic review tool, if applicable. No additional management support is needed unless otherwise documented below in the visit note. 

## 2016-08-14 ENCOUNTER — Other Ambulatory Visit: Payer: Self-pay

## 2016-08-14 ENCOUNTER — Ambulatory Visit (INDEPENDENT_AMBULATORY_CARE_PROVIDER_SITE_OTHER): Payer: Medicare Other

## 2016-08-14 VITALS — BP 142/78 | HR 63 | Ht 72.0 in | Wt 274.8 lb

## 2016-08-14 DIAGNOSIS — Z23 Encounter for immunization: Secondary | ICD-10-CM | POA: Diagnosis not present

## 2016-08-14 DIAGNOSIS — E785 Hyperlipidemia, unspecified: Secondary | ICD-10-CM

## 2016-08-14 DIAGNOSIS — Z0000 Encounter for general adult medical examination without abnormal findings: Secondary | ICD-10-CM

## 2016-08-14 MED ORDER — ATORVASTATIN CALCIUM 10 MG PO TABS: 10.0000 mg | ORAL_TABLET | Freq: Every day | ORAL | 1 refills | Status: DC

## 2016-08-14 NOTE — Telephone Encounter (Signed)
Patient notified and verbalized understanding. Appointment scheduled. 

## 2016-08-14 NOTE — Telephone Encounter (Signed)
Patient in for AWV today, requesting refills for Atorvastatin and Benazepril. Pharmacy updated.

## 2016-08-14 NOTE — Patient Instructions (Addendum)
Continue doing brain stimulating activities (puzzles, reading, adult coloring books, staying active) to keep memory sharp.   Bring a copy of your advance directives to your next office visit.  Continue to eat heart healthy diet (full of fruits, vegetables, whole grains, lean protein, water--limit salt, fat, and sugar intake) and increase physical activity as tolerated.   Fall Prevention in the Home Falls can cause injuries. They can happen to people of all ages. There are many things you can do to make your home safe and to help prevent falls. What can I do on the outside of my home?  Regularly fix the edges of walkways and driveways and fix any cracks.  Remove anything that might make you trip as you walk through a door, such as a raised step or threshold.  Trim any bushes or trees on the path to your home.  Use bright outdoor lighting.  Clear any walking paths of anything that might make someone trip, such as rocks or tools.  Regularly check to see if handrails are loose or broken. Make sure that both sides of any steps have handrails.  Any raised decks and porches should have guardrails on the edges.  Have any leaves, snow, or ice cleared regularly.  Use sand or salt on walking paths during winter.  Clean up any spills in your garage right away. This includes oil or grease spills. What can I do in the bathroom?  Use night lights.  Install grab bars by the toilet and in the tub and shower. Do not use towel bars as grab bars.  Use non-skid mats or decals in the tub or shower.  If you need to sit down in the shower, use a plastic, non-slip stool.  Keep the floor dry. Clean up any water that spills on the floor as soon as it happens.  Remove soap buildup in the tub or shower regularly.  Attach bath mats securely with double-sided non-slip rug tape.  Do not have throw rugs and other things on the floor that can make you trip. What can I do in the bedroom?  Use night  lights.  Make sure that you have a light by your bed that is easy to reach.  Do not use any sheets or blankets that are too big for your bed. They should not hang down onto the floor.  Have a firm chair that has side arms. You can use this for support while you get dressed.  Do not have throw rugs and other things on the floor that can make you trip. What can I do in the kitchen?  Clean up any spills right away.  Avoid walking on wet floors.  Keep items that you use a lot in easy-to-reach places.  If you need to reach something above you, use a strong step stool that has a grab bar.  Keep electrical cords out of the way.  Do not use floor polish or wax that makes floors slippery. If you must use wax, use non-skid floor wax.  Do not have throw rugs and other things on the floor that can make you trip. What can I do with my stairs?  Do not leave any items on the stairs.  Make sure that there are handrails on both sides of the stairs and use them. Fix handrails that are broken or loose. Make sure that handrails are as long as the stairways.  Check any carpeting to make sure that it is firmly attached to the stairs.  Fix any carpet that is loose or worn.  Avoid having throw rugs at the top or bottom of the stairs. If you do have throw rugs, attach them to the floor with carpet tape.  Make sure that you have a light switch at the top of the stairs and the bottom of the stairs. If you do not have them, ask someone to add them for you. What else can I do to help prevent falls?  Wear shoes that:  Do not have high heels.  Have rubber bottoms.  Are comfortable and fit you well.  Are closed at the toe. Do not wear sandals.  If you use a stepladder:  Make sure that it is fully opened. Do not climb a closed stepladder.  Make sure that both sides of the stepladder are locked into place.  Ask someone to hold it for you, if possible.  Clearly mark and make sure that you can  see:  Any grab bars or handrails.  First and last steps.  Where the edge of each step is.  Use tools that help you move around (mobility aids) if they are needed. These include:  Canes.  Walkers.  Scooters.  Crutches.  Turn on the lights when you go into a dark area. Replace any light bulbs as soon as they burn out.  Set up your furniture so you have a clear path. Avoid moving your furniture around.  If any of your floors are uneven, fix them.  If there are any pets around you, be aware of where they are.  Review your medicines with your doctor. Some medicines can make you feel dizzy. This can increase your chance of falling. Ask your doctor what other things that you can do to help prevent falls. This information is not intended to replace advice given to you by your health care provider. Make sure you discuss any questions you have with your health care provider. Document Released: 03/10/2009 Document Revised: 10/20/2015 Document Reviewed: 06/18/2014 Elsevier Interactive Patient Education  2017 [employer] Maintenance, Male A healthy lifestyle and preventive care is important for your health and wellness. Ask your health care provider about what schedule of regular examinations is right for you. What should I know about weight and diet?  Eat a Healthy Diet  Eat plenty of vegetables, fruits, whole grains, low-fat dairy products, and lean protein.  Do not eat a lot of foods high in solid fats, added sugars, or salt. Maintain a Healthy Weight  Regular exercise can help you achieve or maintain a healthy weight. You should:  Do at least 150 minutes of exercise each week. The exercise should increase your heart rate and make you sweat (moderate-intensity exercise).  Do strength-training exercises at least twice a week. Watch Your Levels of Cholesterol and Blood Lipids  Have your blood tested for lipids and cholesterol every 5 years starting at 74 years of age.  If you are at high risk for heart disease, you should start having your blood tested when you are 74 years old. You may need to have your cholesterol levels checked more often if:  Your lipid or cholesterol levels are high.  You are older than 74 years of age.  You are at high risk for heart disease. What should I know about cancer screening? Many types of cancers can be detected early and may often be prevented. Lung Cancer  You should be screened every year for lung cancer if:  You are a current  smoker who has smoked for at least 30 years.  You are a former smoker who has quit within the past 15 years.  Talk to your health care provider about your screening options, when you should start screening, and how often you should be screened. Colorectal Cancer  Routine colorectal cancer screening usually begins at 74 years of age and should be repeated every 5-10 years until you are 74 years old. You may need to be screened more often if early forms of precancerous polyps or small growths are found. Your health care provider may recommend screening at an earlier age if you have risk factors for colon cancer.  Your health care provider may recommend using home test kits to check for hidden blood in the stool.  A small camera at the end of a tube can be used to examine your colon (sigmoidoscopy or colonoscopy). This checks for the earliest forms of colorectal cancer. Prostate and Testicular Cancer  Depending on your age and overall health, your health care provider may do certain tests to screen for prostate and testicular cancer.  Talk to your health care provider about any symptoms or concerns you have about testicular or prostate cancer. Skin Cancer  Check your skin from head to toe regularly.  Tell your health care provider about any new moles or changes in moles, especially if:  There is a change in a mole's size, shape, or color.  You have a mole that is larger than a pencil  eraser.  Always use sunscreen. Apply sunscreen liberally and repeat throughout the day.  Protect yourself by wearing long sleeves, pants, a wide-brimmed hat, and sunglasses when outside. What should I know about heart disease, diabetes, and high blood pressure?  If you are 21-47 years of age, have your blood pressure checked every 3-5 years. If you are 29 years of age or older, have your blood pressure checked every year. You should have your blood pressure measured twice-once when you are at a hospital or clinic, and once when you are not at a hospital or clinic. Record the average of the two measurements. To check your blood pressure when you are not at a hospital or clinic, you can use:  An automated blood pressure machine at a pharmacy.  A home blood pressure monitor.  Talk to your health care provider about your target blood pressure.  If you are between 58-31 years old, ask your health care provider if you should take aspirin to prevent heart disease.  Have regular diabetes screenings by checking your fasting blood sugar level.  If you are at a normal weight and have a low risk for diabetes, have this test once every three years after the age of 44.  If you are overweight and have a high risk for diabetes, consider being tested at a younger age or more often.  A one-time screening for abdominal aortic aneurysm (AAA) by ultrasound is recommended for men aged 77-75 years who are current or former smokers. What should I know about preventing infection? Hepatitis B  If you have a higher risk for hepatitis B, you should be screened for this virus. Talk with your health care provider to find out if you are at risk for hepatitis B infection. Hepatitis C  Blood testing is recommended for:  Everyone born from [birth year] through 1963.  Anyone with known risk factors for hepatitis C. Sexually Transmitted Diseases (STDs)  You should be screened each year for STDs including gonorrhea and  chlamydia if:  You are sexually active and are younger than 74 years of age.  You are older than 74 years of age and your health care provider tells you that you are at risk for this type of infection.  Your sexual activity has changed since you were last screened and you are at an increased risk for chlamydia or gonorrhea. Ask your health care provider if you are at risk.  Talk with your health care provider about whether you are at high risk of being infected with HIV. Your health care provider may recommend a prescription medicine to help prevent HIV infection. What else can I do?  Schedule regular health, dental, and eye exams.  Stay current with your vaccines (immunizations).  Do not use any tobacco products, such as cigarettes, chewing tobacco, and e-cigarettes. If you need help quitting, ask your health care provider.  Limit alcohol intake to no more than 2 drinks per day. One drink equals 12 ounces of beer, 5 ounces of wine, or 1 ounces of hard liquor.  Do not use street drugs.  Do not share needles.  Ask your health care provider for help if you need support or information about quitting drugs.  Tell your health care provider if you often feel depressed.  Tell your health care provider if you have ever been abused or do not feel safe at home. This information is not intended to replace advice given to you by your health care provider. Make sure you discuss any questions you have with your health care provider. Document Released: 11/10/2007 Document Revised: 01/11/2016 Document Reviewed: 02/15/2015 Elsevier Interactive Patient Education  2017 [employer].

## 2016-08-14 NOTE — Telephone Encounter (Signed)
HTN Prescriptions requested during [place] with nurse.  - please check, but it looks like pt is due for his 6 mos HTN f/u. If needed can prescribe 30 d script of Lotensin and have him schedule.  - I will refill his statin, since he is UTD with lipid check in 12/2015.

## 2016-08-16 ENCOUNTER — Encounter: Payer: Self-pay | Admitting: Family Medicine

## 2016-08-16 ENCOUNTER — Ambulatory Visit (INDEPENDENT_AMBULATORY_CARE_PROVIDER_SITE_OTHER): Payer: Medicare Other | Admitting: Family Medicine

## 2016-08-16 VITALS — BP 133/77 | HR 60 | Temp 98.1°F | Resp 20 | Ht 72.0 in | Wt 271.8 lb

## 2016-08-16 DIAGNOSIS — Z6836 Body mass index (BMI) 36.0-36.9, adult: Secondary | ICD-10-CM | POA: Diagnosis not present

## 2016-08-16 DIAGNOSIS — I10 Essential (primary) hypertension: Secondary | ICD-10-CM | POA: Diagnosis not present

## 2016-08-16 DIAGNOSIS — E785 Hyperlipidemia, unspecified: Secondary | ICD-10-CM | POA: Diagnosis not present

## 2016-08-16 LAB — BASIC METABOLIC PANEL WITH GFR
BUN: 23 mg/dL (ref 7–25)
CALCIUM: 9 mg/dL (ref 8.6–10.3)
CO2: 27 mmol/L (ref 20–31)
Chloride: 106 mmol/L (ref 98–110)
Creat: 0.95 mg/dL (ref 0.70–1.18)
GFR, Est Non African American: 79 mL/min (ref >=60)
GLUCOSE: 101 mg/dL — AB (ref 65–99)
Potassium: 4.7 mmol/L (ref 3.5–5.3)
Sodium: 140 mmol/L (ref 135–146)

## 2016-08-16 LAB — CBC WITH DIFFERENTIAL/PLATELET
BASOS ABS: 0 {cells}/uL (ref 0–200)
Basophils Relative: 0 %
EOS ABS: 100 {cells}/uL (ref 15–500)
Eosinophils Relative: 2 %
HCT: 44.2 % (ref 38.5–50.0)
Hemoglobin: 14.8 g/dL (ref 13.2–17.1)
LYMPHS PCT: 24 %
Lymphs Abs: 1200 cells/uL (ref 850–3900)
MCH: 29.9 pg (ref 27.0–33.0)
MCHC: 33.5 g/dL (ref 32.0–36.0)
MCV: 89.3 fL (ref 80.0–100.0)
MONO ABS: 500 {cells}/uL (ref 200–950)
MPV: 11.8 fL (ref 7.5–12.5)
Monocytes Relative: 10 %
NEUTROS PCT: 64 %
Neutro Abs: 3200 cells/uL (ref 1500–7800)
Platelets: 159 10*3/uL (ref 140–400)
RBC: 4.95 MIL/uL (ref 4.20–5.80)
RDW: 14.1 % (ref 11.0–15.0)
WBC: 5 10*3/uL (ref 3.8–10.8)

## 2016-08-16 MED ORDER — BENAZEPRIL HCL 40 MG PO TABS: 40.0000 mg | ORAL_TABLET | Freq: Every morning | ORAL | 1 refills | Status: DC

## 2016-08-16 NOTE — Patient Instructions (Signed)
It was great to see you today.  We will call you with your labs (once I get back).   Please help [place] help you:  We are honored you have chosen [facility] for your Primary Care home. Below you will find basic instructions that you may need to access in the future. Please help [place] help you by reading the instructions, which cover many of the frequent questions we experience.   Prescription refills and request:  -In order to allow more efficient response time, please call your pharmacy for all refills. They will forward the request electronically to [place]. This allows for the quickest possible response. Request left on a nurse line can take longer to refill, since these are checked as time allows between office patients and other phone calls.  - refill request can take up to 3-5 working days to complete.  - If request is sent electronically and request is appropiate, it is usually completed in 1-2 business days.  - all patients will need to be seen routinely for all chronic medical conditions requiring prescription medications (see follow-up below). If you are overdue for follow up on your condition, you will be asked to make an appointment and we will call in enough medication to cover you until your appointment (up to 30 days).  - all controlled substances will require a face to face visit to request/refill.  - if you desire your prescriptions to go through a new pharmacy, and have an active script at original pharmacy, you will need to call your pharmacy and have scripts transferred to new pharmacy. This is completed between the pharmacy locations and not by your provider.    Results: If any images or labs were ordered, it can take up to 1 week to get results depending on the test ordered and the lab/facility running and resulting the test. - Normal or stable results, which do not need further discussion, will be released to your mychart immediately with attached note to you. A call will not be  generated for normal results. Please make certain to sign up for mychart. If you have questions on how to activate your mychart you can call the front office.  - If your results need further discussion, our office will attempt to contact you via phone, and if unable to reach you after 2 attempts, we will release your abnormal result to your mychart with instructions.  - All results will be automatically released in mychart after 1 week.  - Your provider will provide you with explanation and instruction on all relevant material in your results. Please keep in mind, results and labs may appear confusing or abnormal to the untrained eye, but it does not mean they are actually abnormal for you personally. If you have any questions about your results that are not covered, or you desire more detailed explanation than what was provided, you should make an appointment with your provider to do so.   Our office handles many outgoing and incoming calls daily. If we have not contacted you within 1 week about your results, please check your mychart to see if there is a message first and if not, then contact our office.  In helping with this matter, you help decrease call volume, and therefore allow [place] to be able to respond to patients needs more efficiently.   Acute office visits (sick visit):  An acute visit is intended for a new problem and are scheduled in shorter time slots to allow schedule openings  for patients with new problems. This is the appropriate visit to discuss a new problem. In order to provide you with excellent quality medical care with proper time for you to explain your problem, have an exam and receive treatment with instructions, these appointments should be limited to one new problem per visit. If you experience a new problem, in which you desire to be addressed, please make an acute office visit, we save openings on the schedule to accommodate you. Please do not save your new problem for any other  type of visit, let us take care of it properly and quickly for you.   Follow up visits:  Depending on your condition(s) your provider will need to see you routinely in order to provide you with quality care and prescribe medication(s). Most chronic conditions (Example: hypertension, Diabetes, depression/anxiety... etc), require visits a couple times a year. Your provider will instruct you on proper follow up for your personal medical conditions and history. Please make certain to make follow up appointments for your condition as instructed. Failing to do so could result in lapse in your medication treatment/refills. If you request a refill, and are overdue to be seen on a condition, we will always provide you with a 30 day script (once) to allow you time to schedule.    Medicare wellness (well visit): - we have a wonderful Nurse Maudie Mercury), that will meet with you and provide you will yearly medicare wellness visits. These visits should occur yearly (can not be scheduled less than 1 calendar year apart) and cover preventive health, immunizations, advance directives and screenings you are entitled to yearly through your medicare benefits. Do not miss out on your entitled benefits, this is when medicare will pay for these benefits to be ordered for you.  These are strongly encouraged by your provider and is the appropriate type of visit to make certain you are up to date with all preventive health benefits. If you have not had your medicare wellness exam in the last 12 months, please make certain to schedule one by calling the office and schedule your medicare wellness with Maudie Mercury as soon as possible.   Yearly physical (well visit):  - Adults are recommended to be seen yearly for physicals. Check with your insurance and date of your last physical, most insurances require one calendar year between physicals. Physicals include all preventive health topics, screenings, medical exam and labs that are appropriate for  gender/age and history. You may have fasting labs needed at this visit. This is a well visit (not a sick visit), acute topics should not be covered during this visit.  - Pediatric patients are seen more frequently when they are younger. Your provider will advise you on well child visit timing that is appropriate for your their age. - This is not a medicare wellness visit. Medicare wellness exams do not have an exam portion to the visit. Some medicare companies allow for a physical, some do not allow a yearly physical. If your medicare allows a yearly physical you can schedule the medicare wellness with our nurse Maudie Mercury and have your physical with your provider after, on the same day. Please check with insurance for your full benefits.   Late Policy/No Shows:  - all new patients should arrive 15-30 minutes earlier than appointment to allow [place] time  to  obtain all personal demographics,  insurance information and for you to complete office paperwork. - All established patients should arrive 10-15 minutes earlier than appointment time to update  all information and be checked in .  - In our best efforts to run on time, if you are late for your appointment you will be asked to either reschedule or if able, we will work you back into the schedule. There will be a wait time to work you back in the schedule,  depending on availability.  - If you are unable to make it to your appointment as scheduled, please call 24 hours ahead of time to allow [place] to fill the time slot with someone else who needs to be seen. If you do not cancel your appointment ahead of time, you may be charged a no show fee.

## 2016-08-16 NOTE — Progress Notes (Signed)
Patient ID: Jesus Yang, male   DOB: 1940-07-05, 76 y.o.   MRN: [id number]      Patient ID: Jesus Yang, male  DOB: April 22, 1941, 76 y.o.   MRN: [id number]  Subjective:  Jesus Yang is a 76 y.o. male  Hypertension/hyperlipidemia/BMI 39: Patient presents today for follow-up on his hypertension and hyperlipidemia. His blood pressures are at goal at home. He denies chest pain, shortness breath, lower extremity edema or dizziness. He eats a low-salt diet and exercises routinely. He reports compliance with Benzapril 40 mg daily. He is prescribed low-dose Lipitor and takes it daily baby aspirin. He has no family history of heart disease.  Past Medical History:  Diagnosis Date  . Arthralgia    bilateral knees  . BPH (benign prostatic hyperplasia)    follows with urology  . GERD (gastroesophageal reflux disease)   . History of EKG    incomplete RBBB  . Hyperlipidemia   . Hypertension   . Kidney stone   . Renal cell carcinoma of right kidney (HCC)    No Known Allergies Past Surgical History:  Procedure Laterality Date  . GREEN LIGHT LASER TURP (TRANSURETHRAL RESECTION OF PROSTATE  04/18/2012   Procedure: GREEN LIGHT LASER TURP (TRANSURETHRAL RESECTION OF PROSTATE;  Surgeon: Ailene Rud, MD;  Location: [facility];  Service: Urology;  Laterality: N/A;  . JOINT REPLACEMENT     bilateral knee  . KIDNEY CYST REMOVAL  1980  . MELANOMA EXCISION  2006   of chest  . REPLACEMENT TOTAL KNEE    . ROBOTIC ASSITED PARTIAL NEPHRECTOMY Right 10/21/2015   Procedure: XI ROBOTIC ASSISTED PARTIAL RIGHT NEPHRECTOMY;  Surgeon: Alexis Frock, MD;  Location: [facility];  Service: Urology;  Laterality: Right;   Family History  Problem Relation Age of Onset  . Melanoma Father   . Colon cancer Neg Hx    Social History   Social History  . Marital status: Married    Spouse name: N/A  . Number of children: N/A  . Years of education: N/A   Occupational History  . Not on file.   Social History  Main Topics  . Smoking status: Never Smoker  . Smokeless tobacco: Never Used  . Alcohol use Yes     Comment: occassionally  . Drug use: No  . Sexual activity: Yes   Other Topics Concern  . Not on file   Social History Narrative   Retired from TEFL teacher.    Married. 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    Caffeinated beverages.   Wears his seatbelt, wears a bicycle helmet, exercises at least 3 times a week.   Takes a daily vitamin.   There is a smoke alarm in the home.   There are firearms in the home, locked.           ROS: Negative, with the exception of above mentioned in HPI  Objective: BP 133/77 (BP Location: Left Arm, Patient Position: Sitting, Cuff Size: Large)   Pulse 60   Temp 98.1 F (36.7 C)   Resp 20   Ht 6' (1.829 m)   Wt 271 lb 12.8 oz (123.3 kg)   SpO2 97%   BMI 36.86 kg/m  Gen: Afebrile. No acute distress. Nontoxic in appearance, well-developed, well-nourished, very pleasant Caucasian male. HENT: AT. [place].  MMM.  Eyes:Pupils Equal Round Reactive to light, Extraocular movements intact,  Conjunctiva without redness, discharge or icterus. CV: RRR, no edema, +2/4 P posterior tibialis pulses Chest: CTAB, no wheeze or  crackles Abd: Soft.. NTND. BS present.  Neuro:  Normal gait. PERLA. EOMi. Alert. Oriented x3.    Assessment/plan: Jesus Yang is a 76 y.o. male  present for hypertension/hyperlipidemia Essential hypertension, benign/hyperlipidemia/BMI 36 - Blood pressure is stable today. Continue Benzapril 40 mg daily. - Continue low-salt diet, routine exercise. - BMP collected today. - Continue atorvastatin 80 mg daily  -  baby aspirin daily - Follow-up in 6 months. - yearly medicare wellness.   Howard Pouch, DO [facility]

## 2016-08-17 ENCOUNTER — Telehealth: Payer: Self-pay | Admitting: Family Medicine

## 2016-08-17 NOTE — Telephone Encounter (Signed)
Please call pt: - his labs are normal.

## 2016-08-17 NOTE — Telephone Encounter (Signed)
Left message with lab results on patient voice mail per DPR. 

## 2016-12-20 DIAGNOSIS — D1801 Hemangioma of skin and subcutaneous tissue: Secondary | ICD-10-CM | POA: Diagnosis not present

## 2016-12-20 DIAGNOSIS — L570 Actinic keratosis: Secondary | ICD-10-CM | POA: Diagnosis not present

## 2016-12-20 DIAGNOSIS — Z85820 Personal history of malignant melanoma of skin: Secondary | ICD-10-CM | POA: Diagnosis not present

## 2016-12-20 DIAGNOSIS — D239 Other benign neoplasm of skin, unspecified: Secondary | ICD-10-CM | POA: Diagnosis not present

## 2016-12-20 DIAGNOSIS — L821 Other seborrheic keratosis: Secondary | ICD-10-CM | POA: Diagnosis not present

## 2016-12-20 DIAGNOSIS — L814 Other melanin hyperpigmentation: Secondary | ICD-10-CM | POA: Diagnosis not present

## 2017-01-14 ENCOUNTER — Ambulatory Visit (INDEPENDENT_AMBULATORY_CARE_PROVIDER_SITE_OTHER): Payer: Medicare Other | Admitting: Family Medicine

## 2017-01-14 ENCOUNTER — Encounter: Payer: Self-pay | Admitting: Family Medicine

## 2017-01-14 VITALS — BP 139/77 | HR 64 | Temp 98.4°F | Resp 20 | Wt 261.2 lb

## 2017-01-14 DIAGNOSIS — L309 Dermatitis, unspecified: Secondary | ICD-10-CM

## 2017-01-14 MED ORDER — TRIAMCINOLONE ACETONIDE 0.025 % EX OINT: 1.0000 "application " | TOPICAL_OINTMENT | Freq: Two times a day (BID) | CUTANEOUS | 0 refills | Status: DC

## 2017-01-14 MED ORDER — METHYLPREDNISOLONE ACETATE 80 MG/ML IJ SUSP
80.0000 mg | Freq: Once | INTRAMUSCULAR | Status: AC
  Administered 2017-01-14: 80 mg via INTRAMUSCULAR

## 2017-01-14 NOTE — Patient Instructions (Addendum)
Steroid shot today .  Steroid cream to help with itching.   You can use calamine lotion if desired.   Keep clean and dry, try not to scratch.

## 2017-01-14 NOTE — Progress Notes (Signed)
Jesus Yang , August 29, 1940, 76 y.o., male MRN: [id number] Patient Care Team    Relationship Specialty Notifications Start End  Ma Hillock, DO PCP - General Family Medicine  07/13/15   Carolan Clines, MD Consulting Physician Urology  11/10/15     Chief Complaint  Patient presents with  . Rash    blistered type rash left arm, itchy,burning sensation     Subjective: Pt presents for an OV with complaints of Rash of left arm 3 days duration.  Associated symptoms include redness, itchiness and blisters. Patient reports he has 2 patches on his arm, that occurred after cutting back brush. He denies any fever, chills or prodrome. He has not tried anything to help with the symptoms.  Depression screen [facility] 2/9 08/14/2016 07/13/2015  Decreased Interest 0 0  Down, Depressed, Hopeless 0 0  PHQ - 2 Score 0 0    No Known Allergies Social History  Substance Use Topics  . Smoking status: Never Smoker  . Smokeless tobacco: Never Used  . Alcohol use Yes     Comment: occassionally   Past Medical History:  Diagnosis Date  . Arthralgia    bilateral knees  . BPH (benign prostatic hyperplasia)    follows with urology  . GERD (gastroesophageal reflux disease)   . History of EKG    incomplete RBBB  . Hyperlipidemia   . Hypertension   . Kidney stone   . Renal cell carcinoma of right kidney [facility])    Past Surgical History:  Procedure Laterality Date  . GREEN LIGHT LASER TURP (TRANSURETHRAL RESECTION OF PROSTATE  04/18/2012   Procedure: GREEN LIGHT LASER TURP (TRANSURETHRAL RESECTION OF PROSTATE;  Surgeon: Ailene Rud, MD;  Location: [facility];  Service: Urology;  Laterality: N/A;  . JOINT REPLACEMENT     bilateral knee  . KIDNEY CYST REMOVAL  1980  . MELANOMA EXCISION  2006   of chest  . REPLACEMENT TOTAL KNEE    . ROBOTIC ASSITED PARTIAL NEPHRECTOMY Right 10/21/2015   Procedure: XI ROBOTIC ASSISTED PARTIAL RIGHT NEPHRECTOMY;  Surgeon: Alexis Frock, MD;  Location: [facility];   Service: Urology;  Laterality: Right;   Family History  Problem Relation Age of Onset  . Melanoma Father   . Colon cancer Neg Hx    Allergies as of 01/14/2017   No Known Allergies     Medication List       Accurate as of 01/14/17  3:44 PM. Always use your most recent med list.          aspirin EC 81 MG tablet Take 81 mg by mouth daily.   atorvastatin 10 MG tablet Commonly known as:  LIPITOR Take 1 tablet (10 mg total) by mouth daily.   benazepril 40 MG tablet Commonly known as:  LOTENSIN Take 1 tablet (40 mg total) by mouth every morning.   METAMUCIL PO Take by mouth.   REFRESH TEARS 0.5 % Soln Generic drug:  carboxymethylcellulose Place 1 drop into both eyes daily as needed (For dry eyes.).       All past medical history, surgical history, allergies, family history, immunizations andmedications were updated in the EMR today and reviewed under the history and medication portions of their EMR.     ROS: Negative, with the exception of above mentioned in HPI   Objective:  BP 139/77 (BP Location: Left Arm, Patient Position: Sitting, Cuff Size: Normal)   Pulse 64   Temp 98.4 F (36.9 C)   Resp 20  Wt 261 lb 4 oz (118.5 kg)   SpO2 98%   BMI 35.43 kg/m  Body mass index is 35.43 kg/m. Gen: Afebrile. No acute distress. Nontoxic in appearance, well developed, well nourished.  HENT: AT. [place]. MMM, no oral lesions.  Skin: Red base rash with multiple blisters. No purpura or petechiae.   No exam data present No results found. No results found for this or any previous visit (from the past 24 hour(s)).  Assessment/Plan: Jesus Yang is a 76 y.o. male present for OV for  Dermatitis, plant Appears to be a plant dermatitis, likely poison oak versus sumac. Occurred after use cut back brush. Discussed keeping the area clean, dry and no scratching. - IM Depo-Medrol provided today, Kenalog cream prescribed. May use calamine lotion. - methylPREDNISolone acetate  (DEPO-MEDROL) injection 80 mg; Inject 1 mL (80 mg total) into the muscle once. - Follow-up as needed, sooner if worsening.   Reviewed expectations re: course of current medical issues.  Discussed self-management of symptoms.  Outlined signs and symptoms indicating need for more acute intervention.  Patient verbalized understanding and all questions were answered.  Patient received an After-Visit Summary.    No orders of the defined types were placed in this encounter.    Note is dictated utilizing voice recognition software. Although note has been proof read prior to signing, occasional typographical errors still can be missed. If any questions arise, please do not hesitate to call for verification.   electronically signed by:  Howard Pouch, DO  [place]

## 2017-01-15 DIAGNOSIS — Z23 Encounter for immunization: Secondary | ICD-10-CM | POA: Diagnosis not present

## 2017-01-31 ENCOUNTER — Other Ambulatory Visit: Payer: Self-pay | Admitting: *Deleted

## 2017-01-31 ENCOUNTER — Encounter: Payer: Self-pay | Admitting: *Deleted

## 2017-01-31 MED ORDER — BENAZEPRIL HCL 40 MG PO TABS: 40.0000 mg | ORAL_TABLET | Freq: Every morning | ORAL | 0 refills | Status: DC

## 2017-01-31 NOTE — Telephone Encounter (Signed)
Refilled lotensin for 30 day supply patient due for follow up on HTN, patient will need office visit prior to anymore refills. Sent patient message in [facility] Chart

## 2017-02-04 ENCOUNTER — Ambulatory Visit (INDEPENDENT_AMBULATORY_CARE_PROVIDER_SITE_OTHER): Payer: Medicare Other | Admitting: Family Medicine

## 2017-02-04 ENCOUNTER — Encounter: Payer: Self-pay | Admitting: Family Medicine

## 2017-02-04 VITALS — BP 131/81 | HR 62 | Temp 98.6°F | Resp 20 | Ht 72.0 in | Wt 262.5 lb

## 2017-02-04 DIAGNOSIS — Z6835 Body mass index (BMI) 35.0-35.9, adult: Secondary | ICD-10-CM | POA: Diagnosis not present

## 2017-02-04 DIAGNOSIS — I10 Essential (primary) hypertension: Secondary | ICD-10-CM | POA: Diagnosis not present

## 2017-02-04 DIAGNOSIS — E785 Hyperlipidemia, unspecified: Secondary | ICD-10-CM

## 2017-02-04 LAB — LIPID PANEL
CHOLESTEROL: 166 mg/dL (ref 0–200)
HDL: 53 mg/dL (ref >39.00)
LDL CALC: 101 mg/dL — AB (ref 0–99)
NonHDL: 113.19
TRIGLYCERIDES: 59 mg/dL (ref 0.0–149.0)
Total CHOL/HDL Ratio: 3
VLDL: 11.8 mg/dL (ref 0.0–40.0)

## 2017-02-04 MED ORDER — BENAZEPRIL HCL 40 MG PO TABS
40.0000 mg | ORAL_TABLET | Freq: Every morning | ORAL | 0 refills | Status: DC
Start: 2017-02-04 — End: 2017-04-30

## 2017-02-04 MED ORDER — ATORVASTATIN CALCIUM 10 MG PO TABS: 10.0000 mg | ORAL_TABLET | Freq: Every day | ORAL | 3 refills | Status: DC

## 2017-02-04 NOTE — Progress Notes (Signed)
Patient ID: Jesus Yang, male   DOB: December 16, 1940, 76 y.o.   MRN: [id number]      Patient ID: Jesus Yang, male  DOB: [birth year], 76 y.o.   MRN: [id number]  Chief Complaint  Patient presents with  . Hypertension    Subjective:  Jesus Yang is a 76 y.o. male  Hypertension/hyperlipidemia/BMI 61: Pt reports compliance with benzapril 40 mg and atorvastatin 10 mg Qd. Blood pressures ranges at home within range. Patient denies chest pain, shortness of breath or lower extremity edema. Pt takes a daily baby ASA. Pt is  prescribed statin. BMP: 08/16/2016 WNL CBC: 08/16/2016 WNL Lipids: 01/20/2016 Total 153, HDL 41, ldl 96, 77 Diet: Low sodium, low saturated fat Exercise: not routine RF: HTN, HLD, obesity  Past Medical History:  Diagnosis Date  . Arthralgia    bilateral knees  . BPH (benign prostatic hyperplasia)    follows with urology  . GERD (gastroesophageal reflux disease)   . History of EKG    incomplete RBBB  . Hyperlipidemia   . Hypertension   . Kidney stone   . Renal cell carcinoma of right kidney (HCC)    No Known Allergies Past Surgical History:  Procedure Laterality Date  . GREEN LIGHT LASER TURP (TRANSURETHRAL RESECTION OF PROSTATE  04/18/2012   Procedure: GREEN LIGHT LASER TURP (TRANSURETHRAL RESECTION OF PROSTATE;  Surgeon: Jesus Rud, MD;  [facility];  Service: Urology;  Laterality: N/A;  . JOINT REPLACEMENT     bilateral knee  . KIDNEY CYST REMOVAL  1980  . MELANOMA EXCISION  2006   of chest  . REPLACEMENT TOTAL KNEE    . ROBOTIC ASSITED PARTIAL NEPHRECTOMY Right 10/21/2015   Procedure: XI ROBOTIC ASSISTED PARTIAL RIGHT NEPHRECTOMY;  Surgeon: Jesus Frock, MD;  [facility]: [facility];  Service: Urology;  Laterality: Right;   Family History  Problem Relation Age of Onset  . Melanoma Father   . Colon cancer Neg Hx    Social History   Social History  . Marital status: Married    Spouse name: N/A  . Number of children: N/A  . Years of  education: N/A   Occupational History  . Not on file.   Social History Main Topics  . Smoking status: Never Smoker  . Smokeless tobacco: Never Used  . Alcohol use Yes     Comment: occassionally  . Drug use: No  . Sexual activity: Yes   Other Topics Concern  . Not on file   Social History Narrative   Retired from TEFL teacher.    Married. 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    Caffeinated beverages.   Wears his seatbelt, wears a bicycle helmet, exercises at least 3 times a week.   Takes a daily vitamin.   There is a smoke alarm in the home.   There are firearms in the home, locked.           ROS: Negative, with the exception of above mentioned in HPI  Objective: BP 131/81 (BP Location: Left Arm, Patient Position: Sitting, Cuff Size: Large)   Pulse 62   Temp 98.6 F (37 C)   Resp 20   Ht 6' (1.829 m)   Wt 262 lb 8 oz (119.1 kg)   SpO2 97%   BMI 35.60 kg/m  Gen: Afebrile. No acute distress. Nontoxic in appearance, obese, very pleasant Caucasian male. HENT: AT. [place].  MMM.  Eyes:Pupils Equal Round Reactive to light, Extraocular movements intact,  Conjunctiva without redness, discharge  or icterus. Neck/lymp/endocrine: Supple, no lymphadenopathy, no thyromegaly CV: RRR no murmur, no edema, +2/4 P posterior tibialis pulses Chest: CTAB, no wheeze or crackles Abd: Soft. Obese. NTND. BS present.   Neuro: Normal gait. PERLA. EOMi. Alert. Oriented x3  Assessment/plan: Jesus Yang is a 76 y.o. male  present for hypertension/hyperlipidemia Essential hypertension, benign/hyperlipidemia/BMI 35 - Blood pressure stable today. Continue Benzapril 40 mg daily. Refills provided today.  - Continue low-salt diet, routine exercise greater than 150 minutes a week. - lipids collected today, he is fasting. - Continue atorvastatin 80 mg daily  - Continue  baby aspirin daily - Follow-up in 6 months.  Jesus Pouch, DO [place]

## 2017-02-04 NOTE — Patient Instructions (Signed)
It was great to see you today. We will call you with the lab results once available.   I have refilled your meds.    Please help [place] help you:  We are honored you have chosen [place] for your Primary Care home. Below you will find basic instructions that you may need to access in the future. Please help [place] help you by reading the instructions, which cover many of the frequent questions we experience.   Prescription refills and request:  -In order to allow more efficient response time, please call your pharmacy for all refills. They will forward the request electronically to [place]. This allows for the quickest possible response. Request left on a nurse line can take longer to refill, since these are checked as time allows between office patients and other phone calls.  - refill request can take up to 3-5 working days to complete.  - If request is sent electronically and request is appropiate, it is usually completed in 1-2 business days.  - all patients will need to be seen routinely for all chronic medical conditions requiring prescription medications (see follow-up below). If you are overdue for follow up on your condition, you will be asked to make an appointment and we will call in enough medication to cover you until your appointment (up to 30 days).  - all controlled substances will require a face to face visit to request/refill.  - if you desire your prescriptions to go through a new pharmacy, and have an active script at original pharmacy, you will need to call your pharmacy and have scripts transferred to new pharmacy. This is completed between the pharmacy locations and not by your provider.    Results: If any images or labs were ordered, it can take up to 1 week to get results depending on the test ordered and the lab/facility running and resulting the test. - Normal or stable results, which do not need further discussion, may be released to your mychart immediately with attached  note to you. A call may not be generated for normal results. Please make certain to sign up for mychart. If you have questions on how to activate your mychart you can call the front office.  - If your results need further discussion, our office will attempt to contact you via phone, and if unable to reach you after 2 attempts, we will release your abnormal result to your mychart with instructions.  - All results will be automatically released in mychart after 1 week.  - Your provider will provide you with explanation and instruction on all relevant material in your results. Please keep in mind, results and labs may appear confusing or abnormal to the untrained eye, but it does not mean they are actually abnormal for you personally. If you have any questions about your results that are not covered, or you desire more detailed explanation than what was provided, you should make an appointment with your provider to do so.   Our office handles many outgoing and incoming calls daily. If we have not contacted you within 1 week about your results, please check your mychart to see if there is a message first and if not, then contact our office.  In helping with this matter, you help decrease call volume, and therefore allow [place] to be able to respond to patients needs more efficiently.   Acute office visits (sick visit):  An acute visit is intended for a new problem and are scheduled in shorter  time slots to allow schedule openings for patients with new problems. This is the appropriate visit to discuss a new problem. In order to provide you with excellent quality medical care with proper time for you to explain your problem, have an exam and receive treatment with instructions, these appointments should be limited to one new problem per visit. If you experience a new problem, in which you desire to be addressed, please make an acute office visit, we save openings on the schedule to accommodate you. Please do not save  your new problem for any other type of visit, let us take care of it properly and quickly for you.   Follow up visits:  Depending on your condition(s) your provider will need to see you routinely in order to provide you with quality care and prescribe medication(s). Most chronic conditions (Example: hypertension, Diabetes, depression/anxiety... etc), require visits a couple times a year. Your provider will instruct you on proper follow up for your personal medical conditions and history. Please make certain to make follow up appointments for your condition as instructed. Failing to do so could result in lapse in your medication treatment/refills. If you request a refill, and are overdue to be seen on a condition, we will always provide you with a 30 day script (once) to allow you time to schedule.    Medicare wellness (well visit): - we have a wonderful Nurse Maudie Mercury), that will meet with you and provide you will yearly medicare wellness visits. These visits should occur yearly (can not be scheduled less than 1 calendar year apart) and cover preventive health, immunizations, advance directives and screenings you are entitled to yearly through your medicare benefits. Do not miss out on your entitled benefits, this is when medicare will pay for these benefits to be ordered for you.  These are strongly encouraged by your provider and is the appropriate type of visit to make certain you are up to date with all preventive health benefits. If you have not had your medicare wellness exam in the last 12 months, please make certain to schedule one by calling the office and schedule your medicare wellness with Maudie Mercury as soon as possible.   Yearly physical (well visit):  - Adults are recommended to be seen yearly for physicals. Check with your insurance and date of your last physical, most insurances require one calendar year between physicals. Physicals include all preventive health topics, screenings, medical exam and  labs that are appropriate for gender/age and history. You may have fasting labs needed at this visit. This is a well visit (not a sick visit), new problems should not be covered during this visit (see acute visit).  - Pediatric patients are seen more frequently when they are younger. Your provider will advise you on well child visit timing that is appropriate for your their age. - This is not a medicare wellness visit. Medicare wellness exams do not have an exam portion to the visit. Some medicare companies allow for a physical, some do not allow a yearly physical. If your medicare allows a yearly physical you can schedule the medicare wellness with our nurse Maudie Mercury and have your physical with your provider after, on the same day. Please check with insurance for your full benefits.   Late Policy/No Shows:  - all new patients should arrive 15-30 minutes earlier than appointment to allow [place] time  to  obtain all personal demographics,  insurance information and for you to complete office paperwork. - All established patients should  arrive 10-15 minutes earlier than appointment time to update all information and be checked in .  - In our best efforts to run on time, if you are late for your appointment you will be asked to either reschedule or if able, we will work you back into the schedule. There will be a wait time to work you back in the schedule,  depending on availability.  - If you are unable to make it to your appointment as scheduled, please call 24 hours ahead of time to allow [place] to fill the time slot with someone else who needs to be seen. If you do not cancel your appointment ahead of time, you may be charged a no show fee.

## 2017-03-02 DIAGNOSIS — K0889 Other specified disorders of teeth and supporting structures: Secondary | ICD-10-CM | POA: Diagnosis not present

## 2017-04-30 ENCOUNTER — Other Ambulatory Visit: Payer: Self-pay | Admitting: *Deleted

## 2017-04-30 ENCOUNTER — Telehealth: Payer: Self-pay | Admitting: Family Medicine

## 2017-04-30 DIAGNOSIS — I10 Essential (primary) hypertension: Secondary | ICD-10-CM

## 2017-04-30 MED ORDER — BENAZEPRIL HCL 40 MG PO TABS: 40.0000 mg | ORAL_TABLET | Freq: Every morning | ORAL | 0 refills | Status: DC

## 2017-04-30 NOTE — Telephone Encounter (Signed)
Patient has current Rx at [facility] but wants to change to [facility] informed patient I had sent a request for patients Rx to be transferred. Patient states he will call pharmacy to make sure they get Rx transferred.

## 2017-04-30 NOTE — Telephone Encounter (Signed)
Copied from [place] [id number]. Topic: Inquiry >> Apr 30, 2017 11:58 AM Cecelia Byars, NT wrote: Reason for CRM: Patient says pharmacy says litpitor is refillable until the practice called ,presription says 90 day supply please call pharmacy

## 2017-05-10 ENCOUNTER — Other Ambulatory Visit: Payer: Self-pay | Admitting: Urology

## 2017-05-10 ENCOUNTER — Ambulatory Visit (HOSPITAL_COMMUNITY)
Admission: RE | Admit: 2017-05-10 | Discharge: 2017-05-10 | Disposition: A | Discharge status: Home / Self Care | Payer: Medicare Other | Source: Ambulatory Visit | Attending: Urology | Admitting: Urology

## 2017-05-10 DIAGNOSIS — Z85528 Personal history of other malignant neoplasm of kidney: Secondary | ICD-10-CM | POA: Diagnosis not present

## 2017-05-10 DIAGNOSIS — C641 Malignant neoplasm of right kidney, except renal pelvis: Secondary | ICD-10-CM | POA: Insufficient documentation

## 2017-05-14 DIAGNOSIS — N401 Enlarged prostate with lower urinary tract symptoms: Secondary | ICD-10-CM | POA: Diagnosis not present

## 2017-05-14 DIAGNOSIS — R3912 Poor urinary stream: Secondary | ICD-10-CM | POA: Diagnosis not present

## 2017-05-14 DIAGNOSIS — C641 Malignant neoplasm of right kidney, except renal pelvis: Secondary | ICD-10-CM | POA: Diagnosis not present

## 2017-05-14 DIAGNOSIS — R972 Elevated prostate specific antigen [PSA]: Secondary | ICD-10-CM | POA: Diagnosis not present

## 2017-05-14 DIAGNOSIS — N281 Cyst of kidney, acquired: Secondary | ICD-10-CM | POA: Diagnosis not present

## 2017-05-15 DIAGNOSIS — M48061 Spinal stenosis, lumbar region without neurogenic claudication: Secondary | ICD-10-CM | POA: Diagnosis not present

## 2017-05-15 DIAGNOSIS — M5431 Sciatica, right side: Secondary | ICD-10-CM | POA: Diagnosis not present

## 2017-06-26 ENCOUNTER — Encounter: Payer: Self-pay | Admitting: Family Medicine

## 2017-06-26 ENCOUNTER — Ambulatory Visit (INDEPENDENT_AMBULATORY_CARE_PROVIDER_SITE_OTHER): Payer: Medicare Other | Admitting: Family Medicine

## 2017-06-26 VITALS — BP 138/83 | HR 62 | Temp 97.8°F | Resp 20 | Ht 72.0 in | Wt 269.2 lb

## 2017-06-26 DIAGNOSIS — E785 Hyperlipidemia, unspecified: Secondary | ICD-10-CM

## 2017-06-26 DIAGNOSIS — I10 Essential (primary) hypertension: Secondary | ICD-10-CM | POA: Diagnosis not present

## 2017-06-26 MED ORDER — ATORVASTATIN CALCIUM 10 MG PO TABS: 10.0000 mg | ORAL_TABLET | Freq: Every day | ORAL | 3 refills | Status: DC

## 2017-06-26 MED ORDER — BENAZEPRIL HCL 40 MG PO TABS: 40.0000 mg | ORAL_TABLET | Freq: Every morning | ORAL | 1 refills | Status: DC

## 2017-06-26 MED ORDER — BENAZEPRIL HCL 40 MG PO TABS: 40.0000 mg | ORAL_TABLET | Freq: Every morning | ORAL | 0 refills | Status: DC

## 2017-06-26 NOTE — Patient Instructions (Signed)
Good luck to you guys! I have refilled your medications today for 6 months (printed).  Followup in 6 months

## 2017-06-26 NOTE — Progress Notes (Signed)
Patient ID: Jesus Yang, male   DOB: 13-Nov-1940, 77 y.o.   MRN: [id number]      Patient ID: Jesus Yang, male  DOB: Jun 01, 1940, 77 y.o.   MRN: [id number]  Chief Complaint  Patient presents with  . Hypertension  . Hyperlipidemia    Subjective:  Jesus Yang is a 77 y.o. male  Hypertension/hyperlipidemia/BMI 23: Pt reports compliant with benzapril 40 mg and atorvastatin 10 mg Qd. Blood pressures ranges at home within normal range. Patient denies chest pain, shortness of breath, dizziness or lower extremity edema.   Pt takes a daily baby ASA. Pt is  prescribed statin. BMP: 08/16/2016 WNL CBC: 08/16/2016 WNL Lipids: 02/04/2017 total 166, HDL 53, LDL 101, triglycerides 59 Diet: Low sodium, low saturated fat Exercise: not routine RF: HTN, HLD, obesity  Past Medical History:  Diagnosis Date  . Arthralgia    bilateral knees  . BPH (benign prostatic hyperplasia)    follows with urology  . GERD (gastroesophageal reflux disease)   . History of EKG    incomplete RBBB  . Hyperlipidemia   . Hypertension   . Kidney stone   . Renal cell carcinoma of right kidney (HCC)    No Known Allergies Past Surgical History:  Procedure Laterality Date  . GREEN LIGHT LASER TURP (TRANSURETHRAL RESECTION OF PROSTATE  04/18/2012   Procedure: GREEN LIGHT LASER TURP (TRANSURETHRAL RESECTION OF PROSTATE;  Surgeon: Ailene Rud, MD;  [facility];  Service: Urology;  Laterality: N/A;  . JOINT REPLACEMENT     bilateral knee  . KIDNEY CYST REMOVAL  1980  . MELANOMA EXCISION  2006   of chest  . REPLACEMENT TOTAL KNEE    . ROBOTIC ASSITED PARTIAL NEPHRECTOMY Right 10/21/2015   Procedure: XI ROBOTIC ASSISTED PARTIAL RIGHT NEPHRECTOMY;  Surgeon: Alexis Frock, MD;  Location: [facility];  Service: Urology;  Laterality: Right;   Family History  Problem Relation Age of Onset  . Melanoma Father   . Colon cancer Neg Hx    Social History   Socioeconomic History  . Marital status: Married   Spouse name: Not on file  . Number of children: Not on file  . Years of education: Not on file  . Highest education level: Not on file  Social Needs  . Financial resource strain: Not on file  . Food insecurity - worry: Not on file  . Food insecurity - inability: Not on file  . Transportation needs - medical: Not on file  . Transportation needs - non-medical: Not on file  Occupational History  . Not on file  Tobacco Use  . Smoking status: Never Smoker  . Smokeless tobacco: Never Used  Substance and Sexual Activity  . Alcohol use: Yes    Comment: occassionally  . Drug use: No  . Sexual activity: Yes  Other Topics Concern  . Not on file  Social History Narrative   Retired from TEFL teacher.    Married. 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    Caffeinated beverages.   Wears his seatbelt, wears a bicycle helmet, exercises at least 3 times a week.   Takes a daily vitamin.   There is a smoke alarm in the home.   There are firearms in the home, locked.        ROS: Negative, with the exception of above mentioned in HPI  Objective: BP 138/83 (BP Location: Left Arm, Patient Position: Sitting, Cuff Size: Large)   Pulse 62   Temp 97.8 F (36.6 C)  Resp 20   Ht 6' (1.829 m)   Wt 269 lb 4 oz (122.1 kg)   SpO2 98%   BMI 36.52 kg/m  Gen: Afebrile. No acute distress. Nontoxic in appearance, well-developed, well-nourished, pleasant obese Caucasian male. HENT: AT. [place]. MMM.  Eyes:Pupils Equal Round Reactive to light, Extraocular movements intact,  Conjunctiva without redness, discharge or icterus. CV: RRR No murmur, no edema, +2/4 P posterior tibialis pulses Chest: CTAB, no wheeze or crackles Abd: Soft. obese. NTND. BS present.  Skin: no rashes, purpura or petechiae. WWW Neuro: Normal gait. PERLA. EOMi. Alert. Oriented x3   Assessment/plan: Jesus Yang is a 77 y.o. male  present for hypertension/hyperlipidemia Essential hypertension, benign/hyperlipidemia/obesity -  stable.continue Benzapril and Lipitor, refills provided today for 6 months. - Continue low-salt diet, routine exercise greater than 150 minutes a week. - next appointment with fasting labs. - Continue  baby aspirin daily - Follow-up in 6 months.  Howard Pouch, DO [place]

## 2017-11-27 ENCOUNTER — Other Ambulatory Visit: Payer: Self-pay | Admitting: *Deleted

## 2017-11-27 DIAGNOSIS — I10 Essential (primary) hypertension: Secondary | ICD-10-CM

## 2017-11-27 MED ORDER — BENAZEPRIL HCL 40 MG PO TABS
40.0000 mg | ORAL_TABLET | Freq: Every morning | ORAL | 0 refills | Status: AC
Start: 2017-11-27 — End: ?

## 2017-11-27 MED ORDER — BENAZEPRIL HCL 40 MG PO TABS: 40.0000 mg | ORAL_TABLET | Freq: Every morning | ORAL | 0 refills | Status: DC

## 2017-12-12 ENCOUNTER — Other Ambulatory Visit: Payer: Self-pay | Admitting: Physician Assistant

## 2017-12-12 DIAGNOSIS — I10 Essential (primary) hypertension: Secondary | ICD-10-CM

## 2017-12-13 NOTE — Telephone Encounter (Signed)
This pt was seen 6 months ago and due for follow up. A short course was refilled when I was out of town. Check with him to schedule an appt, but he was moving so I am not sure if he is even in the area any longer.

## 2017-12-16 ENCOUNTER — Other Ambulatory Visit: Payer: Self-pay | Admitting: Physician Assistant

## 2017-12-16 DIAGNOSIS — I10 Essential (primary) hypertension: Secondary | ICD-10-CM

## 2017-12-16 NOTE — Telephone Encounter (Signed)
Receiving med refills on his Lotensin for the 2nd time.  He has moved. I am not certain if he is  Still a patient here. Please find out - call the patient if needed.  If he is still our pt, we can provide a short term script until we can get him in for his 6 mos follow up. If not, remove me from PCP and script needs sent to his new PCP.  thanks

## 2017-12-16 NOTE — Telephone Encounter (Signed)
Patient has mover out of state and is no longer a patient here.

## 2018-02-19 DIAGNOSIS — Z23 Encounter for immunization: Secondary | ICD-10-CM | POA: Diagnosis not present

## 2018-03-19 IMAGING — CR DG CHEST 2V
2 series · 2 of 2 positions shown · non-contrast
Comparison: Radiographs April 14, 2012.

CLINICAL DATA: Malignant neoplasm of right kidney.

EXAM:
CHEST  2 VIEW

[w chest pa]
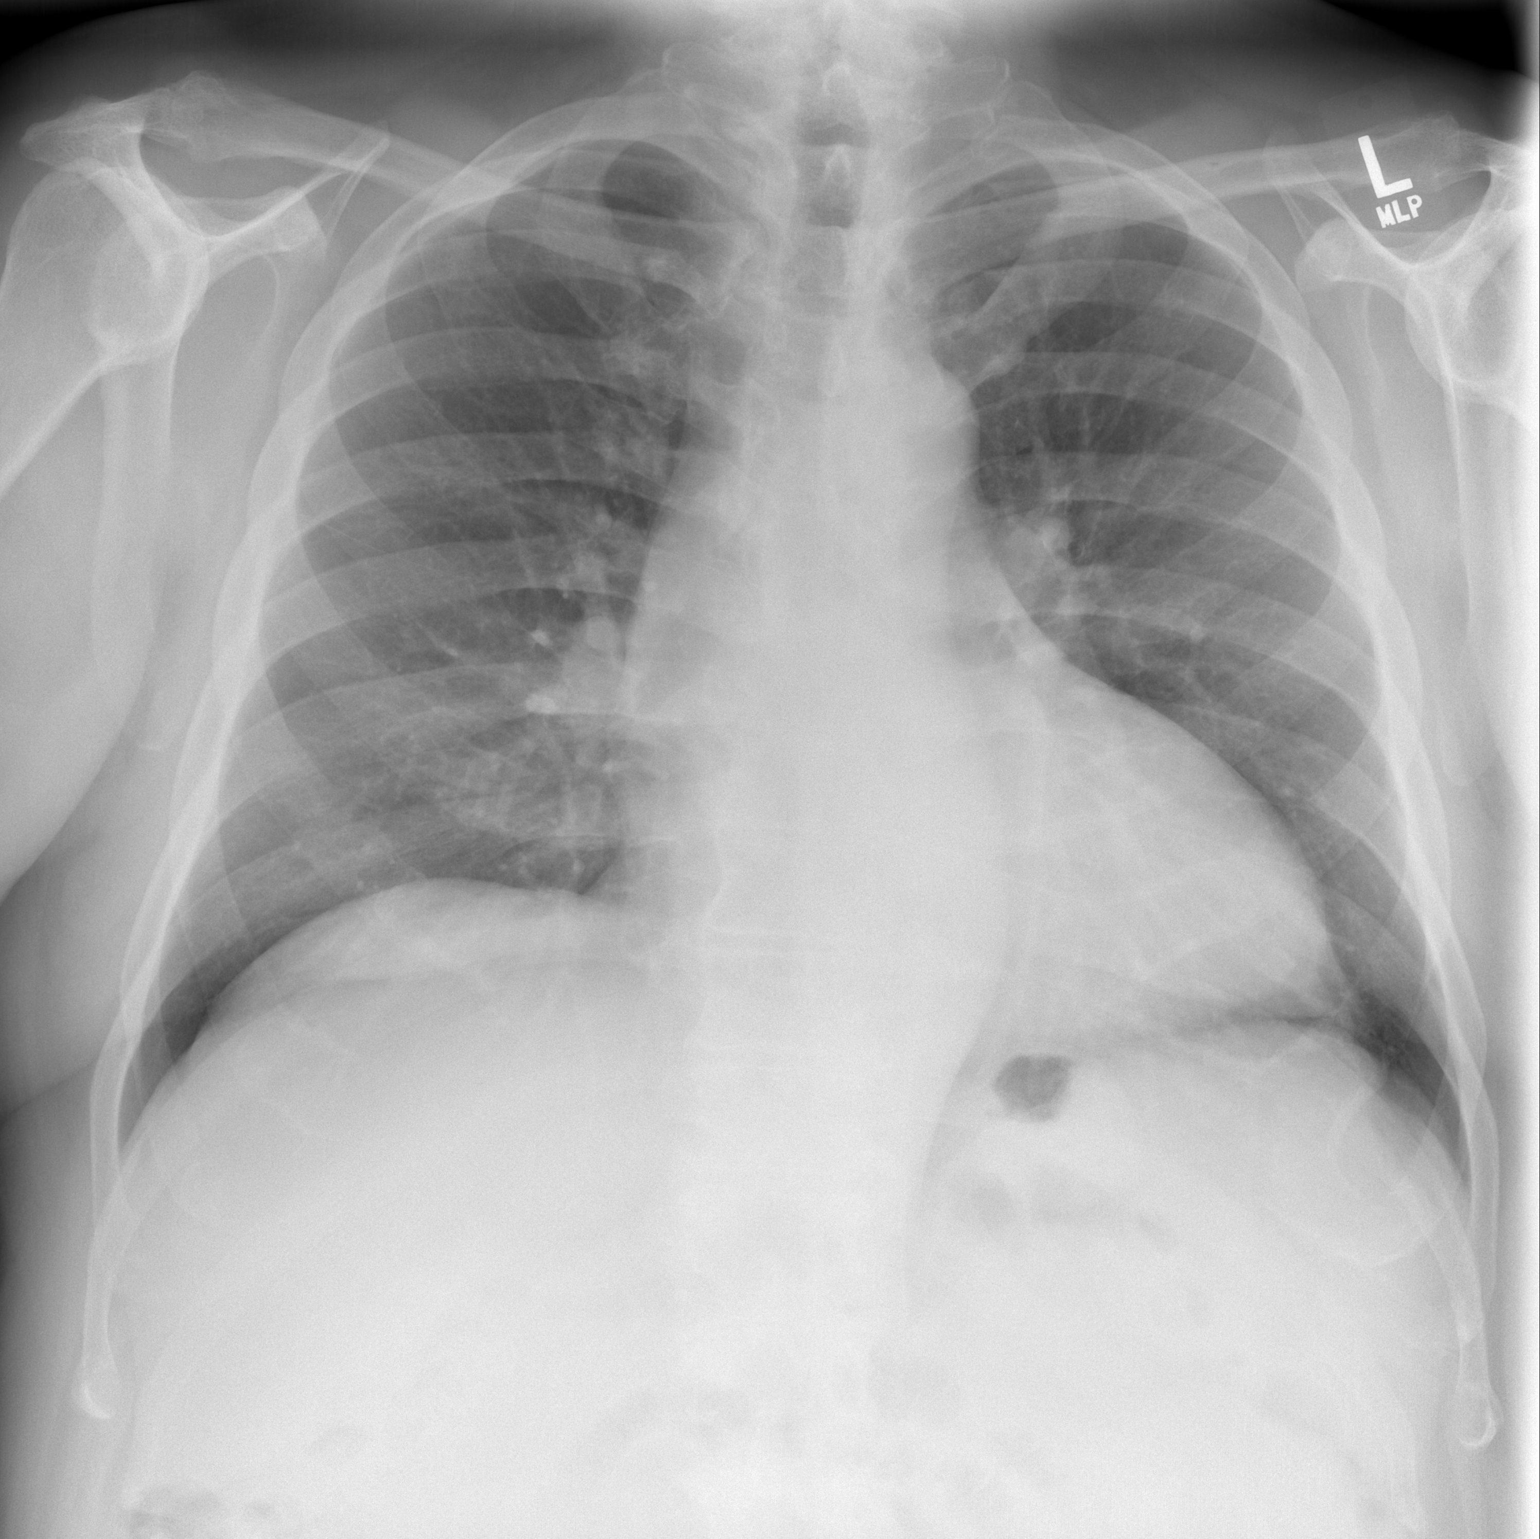

[w chest lat]
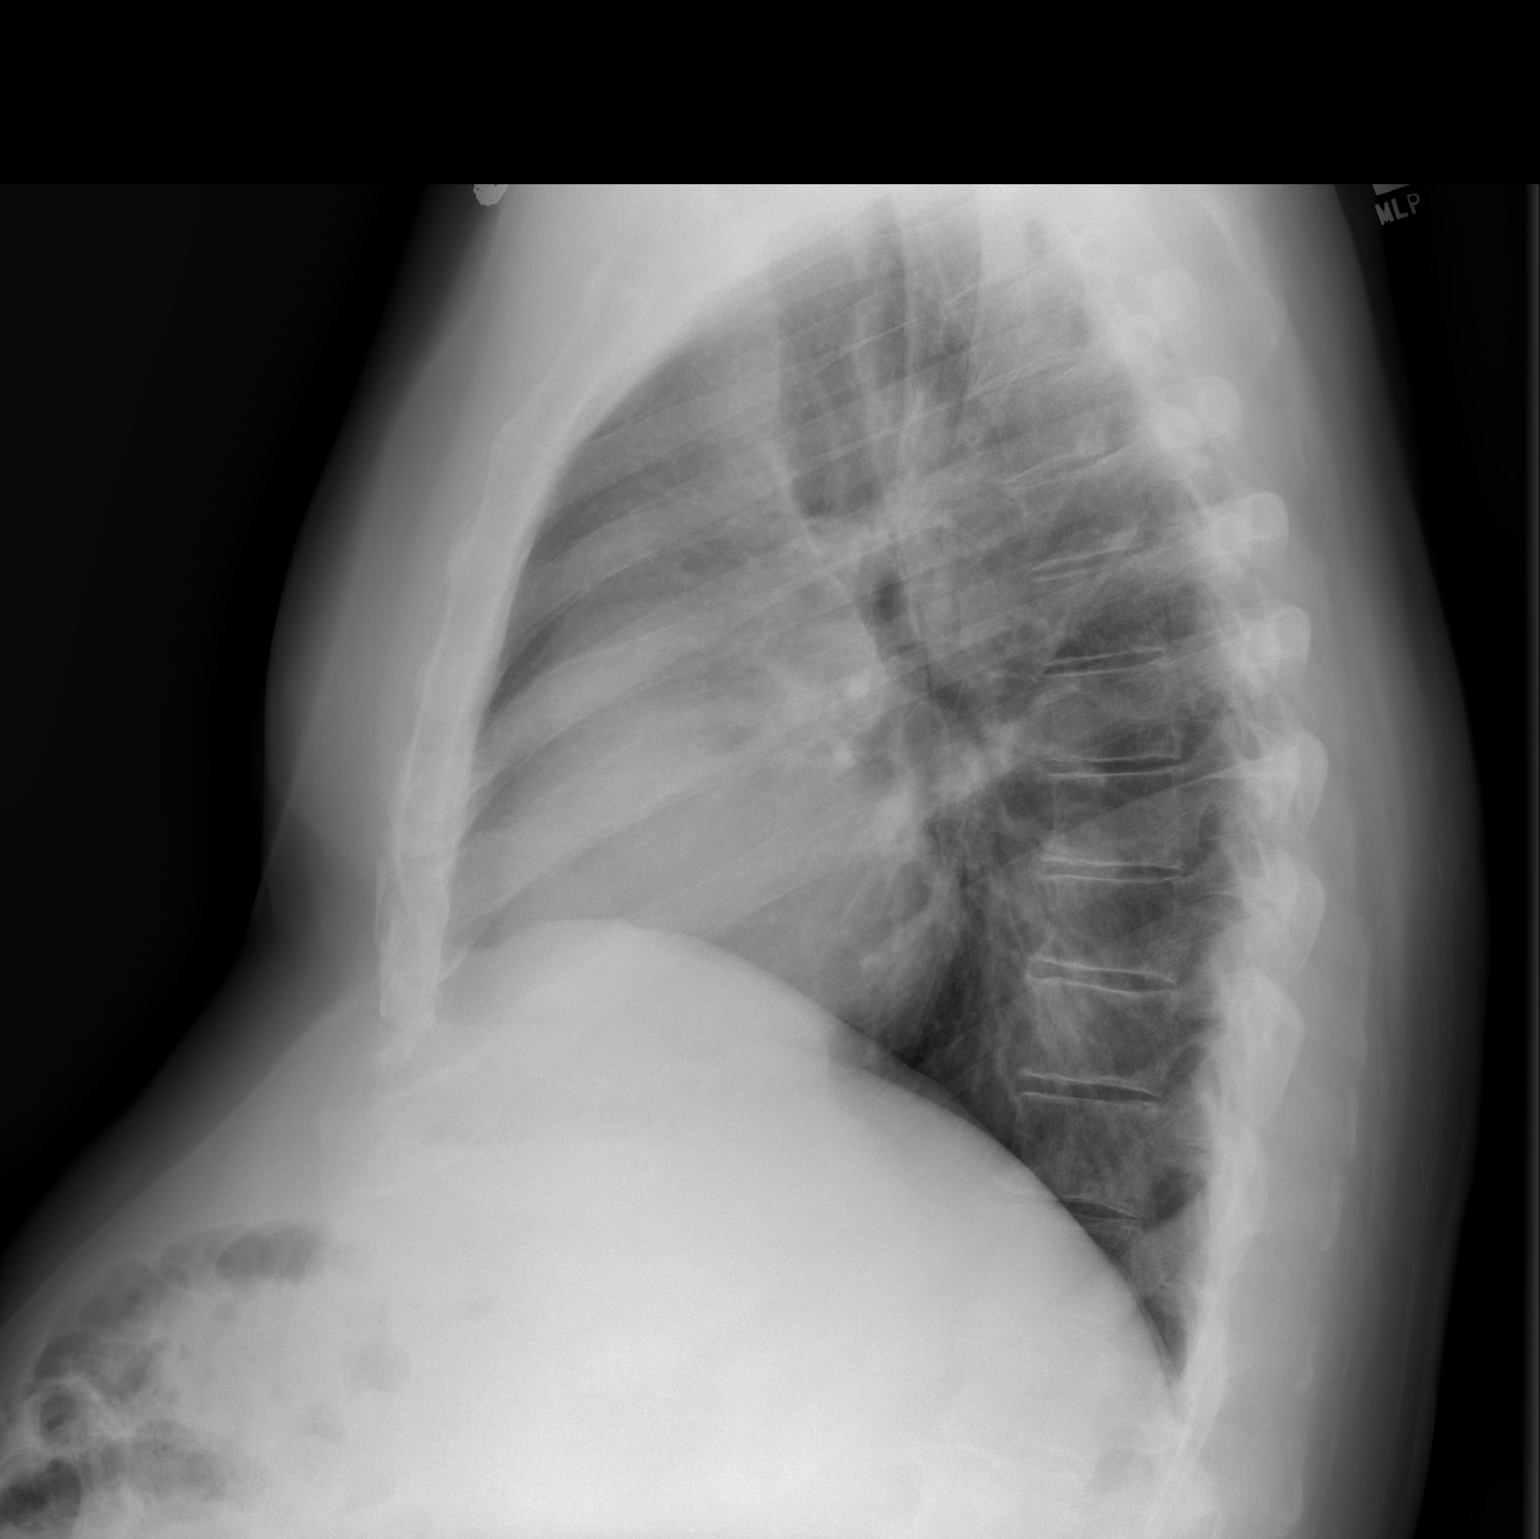

[2 of 2 positions shown; findings below may reference images not displayed]

FINDINGS: Stable cardiomediastinal silhouette. No pneumothorax or pleural
effusion is noted. Both lungs are clear. The visualized skeletal
structures are unremarkable.
IMPRESSION: No active cardiopulmonary disease.

## 2018-05-29 DIAGNOSIS — L570 Actinic keratosis: Secondary | ICD-10-CM | POA: Diagnosis not present

## 2018-05-29 DIAGNOSIS — E7800 Pure hypercholesterolemia, unspecified: Secondary | ICD-10-CM | POA: Diagnosis not present

## 2018-05-29 DIAGNOSIS — Z1283 Encounter for screening for malignant neoplasm of skin: Secondary | ICD-10-CM | POA: Diagnosis not present

## 2018-05-29 DIAGNOSIS — L812 Freckles: Secondary | ICD-10-CM | POA: Diagnosis not present

## 2018-05-29 DIAGNOSIS — I10 Essential (primary) hypertension: Secondary | ICD-10-CM | POA: Diagnosis not present

## 2018-05-29 DIAGNOSIS — E785 Hyperlipidemia, unspecified: Secondary | ICD-10-CM | POA: Diagnosis not present

## 2018-07-06 ENCOUNTER — Other Ambulatory Visit: Payer: Self-pay | Admitting: Family Medicine

## 2018-07-06 DIAGNOSIS — E785 Hyperlipidemia, unspecified: Secondary | ICD-10-CM

## 2018-11-05 DIAGNOSIS — Z85528 Personal history of other malignant neoplasm of kidney: Secondary | ICD-10-CM | POA: Diagnosis not present

## 2018-11-05 DIAGNOSIS — Z87442 Personal history of urinary calculi: Secondary | ICD-10-CM | POA: Diagnosis not present

## 2018-11-05 DIAGNOSIS — R310 Gross hematuria: Secondary | ICD-10-CM | POA: Diagnosis not present

## 2018-11-05 DIAGNOSIS — N401 Enlarged prostate with lower urinary tract symptoms: Secondary | ICD-10-CM | POA: Diagnosis not present

## 2018-11-11 DIAGNOSIS — R9349 Abnormal radiologic findings on diagnostic imaging of other urinary organs: Secondary | ICD-10-CM | POA: Diagnosis not present

## 2018-11-11 DIAGNOSIS — K5730 Diverticulosis of large intestine without perforation or abscess without bleeding: Secondary | ICD-10-CM | POA: Diagnosis not present

## 2018-11-11 DIAGNOSIS — R310 Gross hematuria: Secondary | ICD-10-CM | POA: Diagnosis not present

## 2018-11-11 DIAGNOSIS — N400 Enlarged prostate without lower urinary tract symptoms: Secondary | ICD-10-CM | POA: Diagnosis not present

## 2018-11-17 DIAGNOSIS — N210 Calculus in bladder: Secondary | ICD-10-CM | POA: Diagnosis not present

## 2018-11-17 DIAGNOSIS — R310 Gross hematuria: Secondary | ICD-10-CM | POA: Diagnosis not present

## 2018-11-17 DIAGNOSIS — N401 Enlarged prostate with lower urinary tract symptoms: Secondary | ICD-10-CM | POA: Diagnosis not present

## 2018-11-17 DIAGNOSIS — N281 Cyst of kidney, acquired: Secondary | ICD-10-CM | POA: Diagnosis not present

## 2018-11-26 DIAGNOSIS — R431 Parosmia: Secondary | ICD-10-CM | POA: Diagnosis not present

## 2018-11-26 DIAGNOSIS — N401 Enlarged prostate with lower urinary tract symptoms: Secondary | ICD-10-CM | POA: Diagnosis not present

## 2018-11-26 DIAGNOSIS — R0602 Shortness of breath: Secondary | ICD-10-CM | POA: Diagnosis not present

## 2018-11-26 DIAGNOSIS — Z01818 Encounter for other preprocedural examination: Secondary | ICD-10-CM | POA: Diagnosis not present

## 2018-11-26 DIAGNOSIS — R9431 Abnormal electrocardiogram [ECG] [EKG]: Secondary | ICD-10-CM | POA: Diagnosis not present

## 2018-11-26 DIAGNOSIS — N138 Other obstructive and reflux uropathy: Secondary | ICD-10-CM | POA: Diagnosis not present

## 2018-11-26 DIAGNOSIS — I4510 Unspecified right bundle-branch block: Secondary | ICD-10-CM | POA: Diagnosis not present

## 2018-11-26 DIAGNOSIS — N210 Calculus in bladder: Secondary | ICD-10-CM | POA: Diagnosis not present

## 2018-11-26 DIAGNOSIS — Z01812 Encounter for preprocedural laboratory examination: Secondary | ICD-10-CM | POA: Diagnosis not present

## 2018-11-26 DIAGNOSIS — Z6839 Body mass index (BMI) 39.0-39.9, adult: Secondary | ICD-10-CM | POA: Diagnosis not present

## 2018-11-26 DIAGNOSIS — I10 Essential (primary) hypertension: Secondary | ICD-10-CM | POA: Diagnosis not present

## 2018-11-27 DIAGNOSIS — Z1159 Encounter for screening for other viral diseases: Secondary | ICD-10-CM | POA: Diagnosis not present

## 2018-12-03 DIAGNOSIS — Z01810 Encounter for preprocedural cardiovascular examination: Secondary | ICD-10-CM | POA: Diagnosis not present

## 2018-12-03 DIAGNOSIS — R0602 Shortness of breath: Secondary | ICD-10-CM | POA: Diagnosis not present

## 2018-12-03 DIAGNOSIS — R9431 Abnormal electrocardiogram [ECG] [EKG]: Secondary | ICD-10-CM | POA: Diagnosis not present

## 2018-12-14 DIAGNOSIS — Z1159 Encounter for screening for other viral diseases: Secondary | ICD-10-CM | POA: Diagnosis not present

## 2018-12-14 DIAGNOSIS — Z01818 Encounter for other preprocedural examination: Secondary | ICD-10-CM | POA: Diagnosis not present

## 2018-12-17 DIAGNOSIS — N138 Other obstructive and reflux uropathy: Secondary | ICD-10-CM | POA: Diagnosis not present

## 2018-12-17 DIAGNOSIS — I10 Essential (primary) hypertension: Secondary | ICD-10-CM | POA: Diagnosis not present

## 2018-12-17 DIAGNOSIS — Z79899 Other long term (current) drug therapy: Secondary | ICD-10-CM | POA: Diagnosis not present

## 2018-12-17 DIAGNOSIS — Z6835 Body mass index (BMI) 35.0-35.9, adult: Secondary | ICD-10-CM | POA: Diagnosis not present

## 2018-12-17 DIAGNOSIS — E669 Obesity, unspecified: Secondary | ICD-10-CM | POA: Diagnosis not present

## 2018-12-17 DIAGNOSIS — Z8553 Personal history of malignant neoplasm of renal pelvis: Secondary | ICD-10-CM | POA: Diagnosis not present

## 2018-12-17 DIAGNOSIS — N401 Enlarged prostate with lower urinary tract symptoms: Secondary | ICD-10-CM | POA: Diagnosis not present

## 2018-12-17 DIAGNOSIS — N210 Calculus in bladder: Secondary | ICD-10-CM | POA: Diagnosis not present

## 2018-12-17 DIAGNOSIS — E785 Hyperlipidemia, unspecified: Secondary | ICD-10-CM | POA: Diagnosis not present

## 2018-12-19 DIAGNOSIS — N401 Enlarged prostate with lower urinary tract symptoms: Secondary | ICD-10-CM | POA: Diagnosis not present

## 2019-01-19 DIAGNOSIS — N401 Enlarged prostate with lower urinary tract symptoms: Secondary | ICD-10-CM | POA: Diagnosis not present

## 2019-02-22 DIAGNOSIS — Z23 Encounter for immunization: Secondary | ICD-10-CM | POA: Diagnosis not present

## 2019-07-15 DIAGNOSIS — M25511 Pain in right shoulder: Secondary | ICD-10-CM | POA: Diagnosis not present

## 2019-07-15 DIAGNOSIS — M7541 Impingement syndrome of right shoulder: Secondary | ICD-10-CM | POA: Diagnosis not present

## 2019-07-21 DIAGNOSIS — N138 Other obstructive and reflux uropathy: Secondary | ICD-10-CM | POA: Diagnosis not present

## 2019-07-21 DIAGNOSIS — N401 Enlarged prostate with lower urinary tract symptoms: Secondary | ICD-10-CM | POA: Diagnosis not present

## 2020-01-26 DIAGNOSIS — Z0000 Encounter for general adult medical examination without abnormal findings: Secondary | ICD-10-CM | POA: Diagnosis not present

## 2020-01-26 DIAGNOSIS — E785 Hyperlipidemia, unspecified: Secondary | ICD-10-CM | POA: Diagnosis not present

## 2020-01-26 DIAGNOSIS — Z1283 Encounter for screening for malignant neoplasm of skin: Secondary | ICD-10-CM | POA: Diagnosis not present

## 2020-01-26 DIAGNOSIS — Z125 Encounter for screening for malignant neoplasm of prostate: Secondary | ICD-10-CM | POA: Diagnosis not present

## 2020-01-26 DIAGNOSIS — Z0189 Encounter for other specified special examinations: Secondary | ICD-10-CM | POA: Diagnosis not present

## 2020-01-26 DIAGNOSIS — I10 Essential (primary) hypertension: Secondary | ICD-10-CM | POA: Diagnosis not present

## 2020-01-26 DIAGNOSIS — Z79899 Other long term (current) drug therapy: Secondary | ICD-10-CM | POA: Diagnosis not present

## 2020-01-26 DIAGNOSIS — E7800 Pure hypercholesterolemia, unspecified: Secondary | ICD-10-CM | POA: Diagnosis not present

## 2020-01-26 DIAGNOSIS — K08409 Partial loss of teeth, unspecified cause, unspecified class: Secondary | ICD-10-CM | POA: Diagnosis not present

## 2020-01-26 DIAGNOSIS — Z09 Encounter for follow-up examination after completed treatment for conditions other than malignant neoplasm: Secondary | ICD-10-CM | POA: Diagnosis not present

## 2020-01-27 DIAGNOSIS — M25562 Pain in left knee: Secondary | ICD-10-CM | POA: Diagnosis not present

## 2020-01-27 DIAGNOSIS — Z96652 Presence of left artificial knee joint: Secondary | ICD-10-CM | POA: Diagnosis not present

## 2020-01-27 DIAGNOSIS — M7052 Other bursitis of knee, left knee: Secondary | ICD-10-CM | POA: Diagnosis not present

## 2020-02-26 DIAGNOSIS — Z96652 Presence of left artificial knee joint: Secondary | ICD-10-CM | POA: Diagnosis not present

## 2020-02-26 DIAGNOSIS — M25562 Pain in left knee: Secondary | ICD-10-CM | POA: Diagnosis not present

## 2020-02-26 DIAGNOSIS — M7052 Other bursitis of knee, left knee: Secondary | ICD-10-CM | POA: Diagnosis not present

## 2020-02-29 DIAGNOSIS — D1801 Hemangioma of skin and subcutaneous tissue: Secondary | ICD-10-CM | POA: Diagnosis not present

## 2020-02-29 DIAGNOSIS — L728 Other follicular cysts of the skin and subcutaneous tissue: Secondary | ICD-10-CM | POA: Diagnosis not present

## 2020-02-29 DIAGNOSIS — D485 Neoplasm of uncertain behavior of skin: Secondary | ICD-10-CM | POA: Diagnosis not present

## 2020-02-29 DIAGNOSIS — C44519 Basal cell carcinoma of skin of other part of trunk: Secondary | ICD-10-CM | POA: Diagnosis not present

## 2020-02-29 DIAGNOSIS — T8484XA Pain due to internal orthopedic prosthetic devices, implants and grafts, initial encounter: Secondary | ICD-10-CM | POA: Diagnosis not present

## 2020-02-29 DIAGNOSIS — L821 Other seborrheic keratosis: Secondary | ICD-10-CM | POA: Diagnosis not present

## 2020-02-29 DIAGNOSIS — L814 Other melanin hyperpigmentation: Secondary | ICD-10-CM | POA: Diagnosis not present

## 2020-02-29 DIAGNOSIS — C44619 Basal cell carcinoma of skin of left upper limb, including shoulder: Secondary | ICD-10-CM | POA: Diagnosis not present

## 2020-02-29 DIAGNOSIS — L570 Actinic keratosis: Secondary | ICD-10-CM | POA: Diagnosis not present

## 2020-03-08 DIAGNOSIS — M25562 Pain in left knee: Secondary | ICD-10-CM | POA: Diagnosis not present

## 2020-03-16 DIAGNOSIS — Z96652 Presence of left artificial knee joint: Secondary | ICD-10-CM | POA: Diagnosis not present

## 2020-03-16 DIAGNOSIS — M25562 Pain in left knee: Secondary | ICD-10-CM | POA: Diagnosis not present

## 2020-03-23 DIAGNOSIS — Z23 Encounter for immunization: Secondary | ICD-10-CM | POA: Diagnosis not present

## 2020-03-24 DIAGNOSIS — Z23 Encounter for immunization: Secondary | ICD-10-CM | POA: Diagnosis not present

## 2020-04-05 DIAGNOSIS — C44619 Basal cell carcinoma of skin of left upper limb, including shoulder: Secondary | ICD-10-CM | POA: Diagnosis not present

## 2020-04-06 DIAGNOSIS — M25562 Pain in left knee: Secondary | ICD-10-CM | POA: Diagnosis not present

## 2020-04-06 DIAGNOSIS — Z96652 Presence of left artificial knee joint: Secondary | ICD-10-CM | POA: Diagnosis not present

## 2020-04-26 DIAGNOSIS — I10 Essential (primary) hypertension: Secondary | ICD-10-CM | POA: Diagnosis not present

## 2020-04-26 DIAGNOSIS — S82402A Unspecified fracture of shaft of left fibula, initial encounter for closed fracture: Secondary | ICD-10-CM | POA: Diagnosis not present

## 2020-04-26 DIAGNOSIS — R972 Elevated prostate specific antigen [PSA]: Secondary | ICD-10-CM | POA: Diagnosis not present

## 2020-04-26 DIAGNOSIS — Z9181 History of falling: Secondary | ICD-10-CM | POA: Diagnosis not present

## 2020-04-26 DIAGNOSIS — Z859 Personal history of malignant neoplasm, unspecified: Secondary | ICD-10-CM | POA: Diagnosis not present

## 2020-04-26 DIAGNOSIS — E669 Obesity, unspecified: Secondary | ICD-10-CM | POA: Diagnosis not present

## 2020-04-26 DIAGNOSIS — Z09 Encounter for follow-up examination after completed treatment for conditions other than malignant neoplasm: Secondary | ICD-10-CM | POA: Diagnosis not present

## 2020-04-26 DIAGNOSIS — Z6838 Body mass index (BMI) 38.0-38.9, adult: Secondary | ICD-10-CM | POA: Diagnosis not present

## 2020-04-26 DIAGNOSIS — E785 Hyperlipidemia, unspecified: Secondary | ICD-10-CM | POA: Diagnosis not present

## 2020-04-26 DIAGNOSIS — Z125 Encounter for screening for malignant neoplasm of prostate: Secondary | ICD-10-CM | POA: Diagnosis not present

## 2020-04-26 DIAGNOSIS — Z0189 Encounter for other specified special examinations: Secondary | ICD-10-CM | POA: Diagnosis not present

## 2020-04-26 DIAGNOSIS — Z79899 Other long term (current) drug therapy: Secondary | ICD-10-CM | POA: Diagnosis not present

## 2020-04-26 DIAGNOSIS — E7800 Pure hypercholesterolemia, unspecified: Secondary | ICD-10-CM | POA: Diagnosis not present

## 2020-04-28 DIAGNOSIS — T84063A Wear of articular bearing surface of internal prosthetic left knee joint, initial encounter: Secondary | ICD-10-CM | POA: Diagnosis not present

## 2020-04-28 DIAGNOSIS — T84033A Mechanical loosening of internal left knee prosthetic joint, initial encounter: Secondary | ICD-10-CM | POA: Diagnosis not present

## 2020-04-28 DIAGNOSIS — T84053A Periprosthetic osteolysis of internal prosthetic left knee joint, initial encounter: Secondary | ICD-10-CM | POA: Diagnosis not present

## 2020-04-28 DIAGNOSIS — Z96653 Presence of artificial knee joint, bilateral: Secondary | ICD-10-CM | POA: Diagnosis not present

## 2020-04-28 DIAGNOSIS — M25562 Pain in left knee: Secondary | ICD-10-CM | POA: Diagnosis not present

## 2020-04-28 DIAGNOSIS — M25561 Pain in right knee: Secondary | ICD-10-CM | POA: Diagnosis not present

## 2020-04-28 DIAGNOSIS — Z01818 Encounter for other preprocedural examination: Secondary | ICD-10-CM | POA: Diagnosis not present

## 2020-05-05 DIAGNOSIS — Z471 Aftercare following joint replacement surgery: Secondary | ICD-10-CM | POA: Diagnosis not present

## 2020-05-05 DIAGNOSIS — M25562 Pain in left knee: Secondary | ICD-10-CM | POA: Diagnosis not present

## 2020-05-05 DIAGNOSIS — R262 Difficulty in walking, not elsewhere classified: Secondary | ICD-10-CM | POA: Diagnosis not present

## 2020-05-05 DIAGNOSIS — M25462 Effusion, left knee: Secondary | ICD-10-CM | POA: Diagnosis not present

## 2020-05-05 DIAGNOSIS — Z96652 Presence of left artificial knee joint: Secondary | ICD-10-CM | POA: Diagnosis not present

## 2020-05-17 DIAGNOSIS — Z85528 Personal history of other malignant neoplasm of kidney: Secondary | ICD-10-CM | POA: Diagnosis not present

## 2020-05-17 DIAGNOSIS — M71062 Abscess of bursa, left knee: Secondary | ICD-10-CM | POA: Diagnosis not present

## 2020-05-17 DIAGNOSIS — T84053A Periprosthetic osteolysis of internal prosthetic left knee joint, initial encounter: Secondary | ICD-10-CM | POA: Diagnosis not present

## 2020-05-17 DIAGNOSIS — Z905 Acquired absence of kidney: Secondary | ICD-10-CM | POA: Diagnosis not present

## 2020-05-17 DIAGNOSIS — Z96652 Presence of left artificial knee joint: Secondary | ICD-10-CM | POA: Diagnosis not present

## 2020-05-17 DIAGNOSIS — E669 Obesity, unspecified: Secondary | ICD-10-CM | POA: Diagnosis not present

## 2020-05-17 DIAGNOSIS — I10 Essential (primary) hypertension: Secondary | ICD-10-CM | POA: Diagnosis not present

## 2020-05-17 DIAGNOSIS — Z7982 Long term (current) use of aspirin: Secondary | ICD-10-CM | POA: Diagnosis not present

## 2020-05-17 DIAGNOSIS — G8918 Other acute postprocedural pain: Secondary | ICD-10-CM | POA: Diagnosis not present

## 2020-05-17 DIAGNOSIS — C9030 Solitary plasmacytoma not having achieved remission: Secondary | ICD-10-CM | POA: Diagnosis not present

## 2020-05-17 DIAGNOSIS — E785 Hyperlipidemia, unspecified: Secondary | ICD-10-CM | POA: Diagnosis not present

## 2020-05-17 DIAGNOSIS — Z20822 Contact with and (suspected) exposure to covid-19: Secondary | ICD-10-CM | POA: Diagnosis not present

## 2020-05-17 DIAGNOSIS — T84033A Mechanical loosening of internal left knee prosthetic joint, initial encounter: Secondary | ICD-10-CM | POA: Diagnosis not present

## 2020-05-17 DIAGNOSIS — Z6836 Body mass index (BMI) 36.0-36.9, adult: Secondary | ICD-10-CM | POA: Diagnosis not present

## 2020-05-17 DIAGNOSIS — Z96653 Presence of artificial knee joint, bilateral: Secondary | ICD-10-CM | POA: Diagnosis not present

## 2020-05-17 DIAGNOSIS — Z4789 Encounter for other orthopedic aftercare: Secondary | ICD-10-CM | POA: Diagnosis not present

## 2020-05-18 DIAGNOSIS — L7682 Other postprocedural complications of skin and subcutaneous tissue: Secondary | ICD-10-CM | POA: Diagnosis not present

## 2020-05-18 DIAGNOSIS — M25562 Pain in left knee: Secondary | ICD-10-CM | POA: Diagnosis not present

## 2020-05-18 DIAGNOSIS — E669 Obesity, unspecified: Secondary | ICD-10-CM | POA: Diagnosis not present

## 2020-05-18 DIAGNOSIS — C9030 Solitary plasmacytoma not having achieved remission: Secondary | ICD-10-CM | POA: Diagnosis not present

## 2020-05-18 DIAGNOSIS — G8918 Other acute postprocedural pain: Secondary | ICD-10-CM | POA: Diagnosis not present

## 2020-05-18 DIAGNOSIS — I10 Essential (primary) hypertension: Secondary | ICD-10-CM | POA: Diagnosis not present

## 2020-05-18 DIAGNOSIS — Z96653 Presence of artificial knee joint, bilateral: Secondary | ICD-10-CM | POA: Diagnosis not present

## 2020-05-18 DIAGNOSIS — Z789 Other specified health status: Secondary | ICD-10-CM | POA: Diagnosis not present

## 2020-05-18 DIAGNOSIS — E785 Hyperlipidemia, unspecified: Secondary | ICD-10-CM | POA: Diagnosis not present

## 2020-05-18 DIAGNOSIS — Z96652 Presence of left artificial knee joint: Secondary | ICD-10-CM | POA: Diagnosis not present

## 2020-05-18 DIAGNOSIS — T84053A Periprosthetic osteolysis of internal prosthetic left knee joint, initial encounter: Secondary | ICD-10-CM | POA: Diagnosis not present

## 2020-05-19 DIAGNOSIS — M25562 Pain in left knee: Secondary | ICD-10-CM | POA: Diagnosis not present

## 2020-05-19 DIAGNOSIS — Z96652 Presence of left artificial knee joint: Secondary | ICD-10-CM | POA: Diagnosis not present

## 2020-05-19 DIAGNOSIS — L7682 Other postprocedural complications of skin and subcutaneous tissue: Secondary | ICD-10-CM | POA: Diagnosis not present

## 2020-05-19 DIAGNOSIS — C9030 Solitary plasmacytoma not having achieved remission: Secondary | ICD-10-CM | POA: Diagnosis not present

## 2020-05-19 DIAGNOSIS — Z96653 Presence of artificial knee joint, bilateral: Secondary | ICD-10-CM | POA: Diagnosis not present

## 2020-05-19 DIAGNOSIS — T84053A Periprosthetic osteolysis of internal prosthetic left knee joint, initial encounter: Secondary | ICD-10-CM | POA: Diagnosis not present

## 2020-05-19 DIAGNOSIS — E669 Obesity, unspecified: Secondary | ICD-10-CM | POA: Diagnosis not present

## 2020-05-19 DIAGNOSIS — E785 Hyperlipidemia, unspecified: Secondary | ICD-10-CM | POA: Diagnosis not present

## 2020-05-19 DIAGNOSIS — G8918 Other acute postprocedural pain: Secondary | ICD-10-CM | POA: Diagnosis not present

## 2020-05-19 DIAGNOSIS — Z789 Other specified health status: Secondary | ICD-10-CM | POA: Diagnosis not present

## 2020-05-19 DIAGNOSIS — I10 Essential (primary) hypertension: Secondary | ICD-10-CM | POA: Diagnosis not present

## 2020-05-23 DIAGNOSIS — Z471 Aftercare following joint replacement surgery: Secondary | ICD-10-CM | POA: Diagnosis not present
